# Patient Record
Sex: Female | Born: 1975 | Race: White | Hispanic: No | Marital: Single | Smoking: Current every day smoker
Health system: Southern US, Community
[De-identification: ages and names within clinical notes are randomized; demographics above are authoritative.]

## PROBLEM LIST (undated history)

## (undated) DIAGNOSIS — E119 Type 2 diabetes mellitus without complications: Secondary | ICD-10-CM

## (undated) DIAGNOSIS — I10 Essential (primary) hypertension: Secondary | ICD-10-CM

## (undated) DIAGNOSIS — E7800 Pure hypercholesterolemia, unspecified: Secondary | ICD-10-CM

## (undated) HISTORY — PX: REFRACTIVE SURGERY: SHX103

## (undated) HISTORY — DX: Type 2 diabetes mellitus without complications: E11.9

---

## 1999-06-09 ENCOUNTER — Emergency Department (HOSPITAL_COMMUNITY): Admission: EM | Admit: 1999-06-09 | Discharge: 1999-06-09 | Payer: Self-pay | Admitting: Emergency Medicine

## 1999-06-09 ENCOUNTER — Encounter: Payer: Self-pay | Admitting: Emergency Medicine

## 2004-12-12 ENCOUNTER — Ambulatory Visit: Payer: Self-pay | Admitting: Family Medicine

## 2004-12-17 ENCOUNTER — Inpatient Hospital Stay (HOSPITAL_COMMUNITY): Admission: EM | Admit: 2004-12-17 | Discharge: 2004-12-21 | Payer: Self-pay | Admitting: Emergency Medicine

## 2004-12-17 ENCOUNTER — Ambulatory Visit: Payer: Self-pay | Admitting: Internal Medicine

## 2005-01-19 ENCOUNTER — Ambulatory Visit: Payer: Self-pay | Admitting: Internal Medicine

## 2005-01-25 ENCOUNTER — Ambulatory Visit: Payer: Self-pay | Admitting: Family Medicine

## 2005-02-05 ENCOUNTER — Encounter (HOSPITAL_BASED_OUTPATIENT_CLINIC_OR_DEPARTMENT_OTHER): Admission: RE | Admit: 2005-02-05 | Discharge: 2005-05-06 | Payer: Self-pay | Admitting: Surgery

## 2005-02-06 ENCOUNTER — Ambulatory Visit: Payer: Self-pay | Admitting: Family Medicine

## 2005-02-21 ENCOUNTER — Ambulatory Visit: Payer: Self-pay | Admitting: *Deleted

## 2005-05-14 ENCOUNTER — Ambulatory Visit: Payer: Self-pay | Admitting: Family Medicine

## 2005-08-25 ENCOUNTER — Emergency Department: Payer: Self-pay | Admitting: Internal Medicine

## 2005-12-07 ENCOUNTER — Emergency Department: Payer: Self-pay | Admitting: Emergency Medicine

## 2005-12-08 ENCOUNTER — Other Ambulatory Visit: Payer: Self-pay

## 2005-12-08 ENCOUNTER — Emergency Department: Payer: Self-pay | Admitting: Emergency Medicine

## 2005-12-20 ENCOUNTER — Emergency Department: Payer: Self-pay | Admitting: Emergency Medicine

## 2006-04-29 ENCOUNTER — Emergency Department (HOSPITAL_COMMUNITY): Admission: EM | Admit: 2006-04-29 | Discharge: 2006-04-29 | Payer: Self-pay | Admitting: Emergency Medicine

## 2006-05-18 ENCOUNTER — Emergency Department (HOSPITAL_COMMUNITY): Admission: AD | Admit: 2006-05-18 | Discharge: 2006-05-18 | Payer: Self-pay | Admitting: Emergency Medicine

## 2006-06-17 ENCOUNTER — Ambulatory Visit: Payer: Self-pay | Admitting: Internal Medicine

## 2006-08-31 ENCOUNTER — Emergency Department: Payer: Self-pay | Admitting: Emergency Medicine

## 2007-03-26 ENCOUNTER — Emergency Department: Payer: Self-pay | Admitting: Emergency Medicine

## 2007-05-10 ENCOUNTER — Emergency Department: Payer: Self-pay | Admitting: Internal Medicine

## 2007-07-10 ENCOUNTER — Emergency Department: Payer: Self-pay | Admitting: Emergency Medicine

## 2007-07-13 ENCOUNTER — Emergency Department (HOSPITAL_COMMUNITY): Admission: EM | Admit: 2007-07-13 | Discharge: 2007-07-13 | Payer: Self-pay | Admitting: Emergency Medicine

## 2008-02-17 ENCOUNTER — Emergency Department: Payer: Self-pay | Admitting: Emergency Medicine

## 2008-06-06 ENCOUNTER — Emergency Department: Payer: Self-pay | Admitting: Emergency Medicine

## 2008-08-12 ENCOUNTER — Emergency Department: Payer: Self-pay | Admitting: Emergency Medicine

## 2008-08-13 ENCOUNTER — Ambulatory Visit: Payer: Self-pay | Admitting: Vascular Surgery

## 2008-08-13 ENCOUNTER — Encounter (INDEPENDENT_AMBULATORY_CARE_PROVIDER_SITE_OTHER): Payer: Self-pay | Admitting: Emergency Medicine

## 2008-08-13 ENCOUNTER — Emergency Department (HOSPITAL_COMMUNITY): Admission: EM | Admit: 2008-08-13 | Discharge: 2008-08-13 | Payer: Self-pay | Admitting: Emergency Medicine

## 2008-11-08 ENCOUNTER — Emergency Department (HOSPITAL_COMMUNITY): Admission: EM | Admit: 2008-11-08 | Discharge: 2008-11-08 | Payer: Self-pay | Admitting: Emergency Medicine

## 2009-01-07 ENCOUNTER — Emergency Department: Payer: Self-pay | Admitting: Emergency Medicine

## 2010-05-27 ENCOUNTER — Emergency Department: Payer: Self-pay | Admitting: Emergency Medicine

## 2010-05-30 ENCOUNTER — Inpatient Hospital Stay: Payer: Self-pay | Admitting: Internal Medicine

## 2011-01-16 LAB — GLUCOSE, CAPILLARY: Glucose-Capillary: 268 mg/dL — ABNORMAL HIGH (ref 70–99)

## 2011-02-16 NOTE — H&P (Signed)
NAMEMADYSEN, Bianca Hill                 ACCOUNT NO.:  [id number]   MEDICAL RECORD NO.:  [id number]          PATIENT TYPE:  EMS   LOCATION:  MAJO                         FACILITY:  [facility]   PHYSICIAN:  Sean A. Everardo All, M.D. [facility] OF BIRTH:  [birth year]   DATE OF ADMISSION:  12/17/2004  DATE OF DISCHARGE:                                HISTORY & PHYSICAL   REASON FOR ADMISSION:  Foot ulcer.   HISTORY OF PRESENT ILLNESS:  The patient is a 34 year old woman with one  week of severe pain at the right heel with no associated fever.  She was  treated with Augmentin last week but has not Improved.   PAST MEDICAL HISTORY:  1.  Cigarette smoker.  2.  Seven years of diabetes for which she was prescribed medication years      ago but did not take it consistently and has not done so recently.   MEDICATIONS:  Augmentin and Advil.   SOCIAL HISTORY:  She is single.  She works at [employer].  She is here  with her sister.   FAMILY HISTORY:  Positive for diabetes in both parents.   REVIEW OF SYSTEMS:  She lost about 150 pounds in the past two years.  She  denies the following, nausea, vomiting, shortness of breath, chest pain,  dysuria, rectal bleeding, hematuria, loss of consciousness, sore throat, and  ear pain.   PHYSICAL EXAMINATION:  VITAL SIGNS:  Blood pressure 181/101, heart rate 118,  respiratory rate 20.  The patient is afebrile.  GENERAL:  No distress.  SKIN:  Not diaphoretic.  HEENT:  Head is atraumatic.  Sclerae not icteric.  Pharynx:  No erythema.  NECK:  Supple.  CHEST: Clear to auscultation.  CARDIOVASCULAR:  No JVD, no edema.  Tachycardic, regular rhythm, no murmur.  Pedal pulses are intact.  ABDOMEN:  Soft, obese, nontender.  No hepatosplenomegaly, no mass.  BREASTS/GYNECOLOGIC/RECTAL:  Examinations not done at this time due to  patient's condition.  EXTREMITIES:  The right heel has a bandage that was just placed by Dr.  Read Drivers after his recent debridement.  It is not  removed.  NEUROLOGIC:  Alert, well oriented.  Does not appear anxious nor depressed.  Cranial nerves appear to be intact,, and sensation is intact to touch on the  feet.   LABORATORY DATA AND OTHER STUDIES:  WBC 15,500, hemoglobin 15.8.  Remainder  of laboratory studies pending at this time.   IMPRESSION:  1.  Diabetes with an uncertain metabolic state.  2.  Weight loss probably due to the diabetes.  3.  Foot ulcer contributed to by the diabetes.  4.  Cigarette smoker.  5.  Hypertension which may be situational.  6.  Tachycardia which could be due to her illness itself or thyroid disease.   PLAN:  1.  Blood cultures.  2.  Antibiotics.  3.  Check x-ray of the foot.  4.  Wound care consult.  5.  Will follow hypertension for now.  6.  Will check CBGs and give p.r.n. insulin.  7.  Check TSH.  SAE/MEDQ  D:  12/17/2004  T:  12/17/2004  Job:  [id number]

## 2011-02-16 NOTE — Consult Note (Signed)
NAMEJILLENE, Bianca Hill                 ACCOUNT NO.:  [id number]   MEDICAL RECORD NO.:  [id number]          PATIENT TYPE:  INP   LOCATION:  5741                         FACILITY:  [facility]   PHYSICIAN:  Jake Shark A. Tanda Rockers, M.D.DATE OF BIRTH:  [birth year]   DATE OF CONSULTATION:  02/05/2005  DATE OF DISCHARGE:  12/21/2004                                   CONSULTATION   REASON FOR CONSULTATION:  The patient is a 35 year old insulin-dependent  diabetic who was referred by Dr. Beverley Fiedler for evaluation of an  ulceration on her right heel.   IMPRESSION:  Diabetic foot ulcer.   RECOMMENDATIONS:  The wound was debrided in the wound clinic to healthy  granulating tissue.  We have recommended that the patient bathe twice a day  using antiseptic soap, and we will continue the home health nurse visitation  with the wet-to-dry dressing daily.  We [facility] on a weekly basis.   SUBJECTIVE:  Bianca Hill is a 35 year old female who has had diabetes for  several years.  She has been currently receiving her health care through the  [facility] at [facility].  She developed an ulceration six weeks ago  which is not associated with any specific traumatic incident.  She has been  seen by the home health nurse, with application of topical Accuzyme.  The  wound continues to appear to be becoming larger according to the patient.   PAST MEDICAL HISTORY:  1.  Diabetes.  Her most recent capillary blood glucose was 150.  She is      somewhat elusive in specifics of her management.  2.  Exogenous obesity.   CURRENT MEDICATIONS:  She denies current medications.   PAST SURGICAL HISTORY:  She denies previous surgery.   ALLERGIES:  SHE DENIES ALLERGIES.   FAMILY HISTORY:  Specifically negative for diabetes.  She has two siblings,  neither of which have diabetes and neither of her parents.   SOCIAL HISTORY:  She is single.  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   REVIEW OF SYSTEMS:  Specifically negative for chest pain, shortness of  breath.  Her weight has been stable over the past months.   PHYSICAL EXAMINATION:  GENERAL:  She is alert and oriented and in no acute  distress.  HEENT:  Exam is clear.  NECK:  Supple.  Trachea is midline.  Thyroid nonpalpable.  LUNGS:  Clear.  BREASTS:  Exam is deferred.  ABDOMEN:  Soft.  She has a prominent panniculus.  EXTREMITIES:  Exam is abnormal, with 1+ bilateral edema.  On the right lower  extremity, there is an ulcerated wound on the lateral aspect of the Achilles  area.  The measurements are 4.5 cm in length with a 2.3-cm width and a 3-mm  depth.  There is some granulation tissue after the necrotic tissue has been  debrided.  The dorsalis pedis pulse is readily palpable.  Capillary refill  is normal.   DISCUSSION:  In the wound clinic,  this area was debrided full-thickness,  with a moderate amount of necrotic tissue removed.  A wet-to-dry dressing  was applied.  Cultures, aerobic and anaerobic, were obtained during this  visit, and the patient was instructed in wound care and management.  We have  not placed her on antibiotics.  We have explained the treatment protocol to  the patient in terms that she understands.  She has agreed to be followed up  in this clinic in one week, with interim visits by the home health nurse for  wet-to-dry dressings.      HAN/MEDQ  D:  02/05/2005  T:  02/05/2005  Job:  [id number]   cc:   Fanny Dance. Rankins, M.D.  [address]  [place]  [place] [address]  Fax: [phone]

## 2011-02-16 NOTE — Discharge Summary (Signed)
NAMEHARSHITHA, Bianca Hill                 ACCOUNT NO.:  [id number]   MEDICAL RECORD NO.:  [id number]          PATIENT TYPE:  INP   LOCATION:  5741                         FACILITY:  [facility]   PHYSICIAN:  Rene Paci, M.D. LHCDATE OF BIRTH:  January 07, 1976   DATE OF ADMISSION:  12/17/2004  DATE OF DISCHARGE:  12/21/2004                                 DISCHARGE SUMMARY   DISCHARGE DIAGNOSES:  1. Insulin-dependent diabetes, poorly controlled.  2. Right lower extremity diabetic ulcer with positive methicillin      resistant Staphylococcus aureus.  3. Hypertension.     PAST MEDICAL HISTORY:  1. Tobacco abuse.  2. Diabetes.     HISTORY OF THE PRESENT ILLNESS:  The patient is a 35 year old female with a  one-week history of severe pain in the right heel with no associated fever.  The patient was treated prior to admission with Augmentin, but showed no  improvement; and as a result she presents for admission.  The patient is  admitted for antibiotics and improved diabetic control.   COURSE OF HOSPITALIZATION:  The patient was admitted.  Blood cultures were  performed.  The patient was placed on IV antibiotics.  An x-ray was  performed of the right wound.  Plane films did not show any evidence of  osteomyelitis.  The patient was placed on vancomycin and Avelox, which later  she was change to Bactrim times two weeks.  The patient's blood sugar was  carefully monitored and as a result insulin 70/30 was increased to 26 units  twice daily and subsequently to 24 units twice daily.  Atenolol was also  increased to 50 mg for improved blood pressure control.   DISCHARGE MEDICATIONS:  Medications at discharged included:  1. Insulin 70/30 40 units subcu q.a.m. and q.p.m.  2. Septra DS 1 tab p.o. q.a.m. and 1 tab p.o. q.p.m. times three weeks.  3. Atenolol 50 mg p.o. daily.     FOLLOW UP:  The patient was instructed to follow up with [facility].      MSO/MEDQ  D:  02/27/2005  T:  02/28/2005   Job:  [id number]   cc:   Gregary Signs A. Everardo All, M.D. [facility]

## 2011-07-03 LAB — URINE MICROSCOPIC-ADD ON

## 2011-07-03 LAB — URINALYSIS, ROUTINE W REFLEX MICROSCOPIC
Bilirubin Urine: NEGATIVE
Glucose, UA: >1000 — AB
Ketones, ur: NEGATIVE
Leukocytes, UA: NEGATIVE
Nitrite: NEGATIVE
Protein, ur: 100 — AB
Specific Gravity, Urine: 1.041 — ABNORMAL HIGH
Urobilinogen, UA: 0.2
pH: 5.5

## 2011-07-12 LAB — CBC
HCT: 45.2
MCHC: 34.5
MCV: 89.4
Platelets: 199
RDW: 12.6
WBC: 10.5

## 2011-07-12 LAB — POCT I-STAT CREATININE: Creatinine, Ser: 0.5

## 2011-07-12 LAB — I-STAT 8, (EC8 V) (CONVERTED LAB)
BUN: 9
Bicarbonate: 22.1
HCT: 49 — ABNORMAL HIGH
Operator id: 265201
pCO2, Ven: 40.6 — ABNORMAL LOW

## 2011-07-12 LAB — CULTURE, ROUTINE-ABSCESS

## 2011-07-12 LAB — DIFFERENTIAL
Basophils Relative: 0
Eosinophils Absolute: 0.2
Eosinophils Relative: 2
Lymphs Abs: 2.8

## 2012-01-28 ENCOUNTER — Ambulatory Visit: Payer: Self-pay | Admitting: Internal Medicine

## 2012-01-30 ENCOUNTER — Ambulatory Visit: Payer: Self-pay | Admitting: Internal Medicine

## 2012-12-11 ENCOUNTER — Ambulatory Visit: Payer: Self-pay | Admitting: Family Medicine

## 2013-02-09 ENCOUNTER — Ambulatory Visit: Payer: Self-pay | Admitting: Family Medicine

## 2013-02-09 ENCOUNTER — Encounter: Payer: Self-pay | Admitting: Family Medicine

## 2013-02-09 DIAGNOSIS — Z0289 Encounter for other administrative examinations: Secondary | ICD-10-CM

## 2014-02-09 ENCOUNTER — Emergency Department: Payer: Self-pay | Admitting: Emergency Medicine

## 2014-02-09 LAB — URINALYSIS, COMPLETE
Bilirubin,UR: NEGATIVE
Glucose,UR: 50 mg/dL (ref 0–75)
KETONE: NEGATIVE
NITRITE: NEGATIVE
Ph: 5 (ref 4.5–8.0)
Protein: >=500
SPECIFIC GRAVITY: 1.02 (ref 1.003–1.030)
Squamous Epithelial: 10

## 2014-02-09 LAB — BASIC METABOLIC PANEL
Anion Gap: 9 (ref 7–16)
BUN: 26 mg/dL — ABNORMAL HIGH (ref 7–18)
CHLORIDE: 101 mmol/L (ref 98–107)
CO2: 25 mmol/L (ref 21–32)
CREATININE: 0.94 mg/dL (ref 0.60–1.30)
Calcium, Total: 9.2 mg/dL (ref 8.5–10.1)
EGFR (African American): >60
Glucose: 184 mg/dL — ABNORMAL HIGH (ref 65–99)
OSMOLALITY: 280 (ref 275–301)
POTASSIUM: 3.8 mmol/L (ref 3.5–5.1)
SODIUM: 135 mmol/L — AB (ref 136–145)

## 2014-02-09 LAB — CBC WITH DIFFERENTIAL/PLATELET
BASOS ABS: 0.1 10*3/uL (ref 0.0–0.1)
BASOS PCT: 0.6 %
EOS PCT: 2.1 %
Eosinophil #: 0.3 10*3/uL (ref 0.0–0.7)
HCT: 47.4 % — ABNORMAL HIGH (ref 35.0–47.0)
HGB: 16.2 g/dL — AB (ref 12.0–16.0)
Lymphocyte #: 4.5 10*3/uL — ABNORMAL HIGH (ref 1.0–3.6)
Lymphocyte %: 32.9 %
MCH: 31.1 pg (ref 26.0–34.0)
MCHC: 34.2 g/dL (ref 32.0–36.0)
MCV: 91 fL (ref 80–100)
MONOS PCT: 6.4 %
Monocyte #: 0.9 x10 3/mm (ref 0.2–0.9)
Neutrophil #: 8 10*3/uL — ABNORMAL HIGH (ref 1.4–6.5)
Neutrophil %: 58 %
Platelet: 236 10*3/uL (ref 150–440)
RBC: 5.21 10*6/uL — ABNORMAL HIGH (ref 3.80–5.20)
RDW: 12.9 % (ref 11.5–14.5)
WBC: 13.8 10*3/uL — ABNORMAL HIGH (ref 3.6–11.0)

## 2014-07-28 ENCOUNTER — Inpatient Hospital Stay: Payer: Self-pay | Admitting: Internal Medicine

## 2014-07-28 LAB — CBC WITH DIFFERENTIAL/PLATELET
BASOS ABS: 0.1 10*3/uL (ref 0.0–0.1)
BASOS PCT: 0.5 %
Eosinophil #: 0.6 10*3/uL (ref 0.0–0.7)
Eosinophil %: 4.1 %
HCT: 47.7 % — ABNORMAL HIGH (ref 35.0–47.0)
HGB: 15.4 g/dL (ref 12.0–16.0)
LYMPHS PCT: 18.8 %
Lymphocyte #: 2.8 10*3/uL (ref 1.0–3.6)
MCH: 30.1 pg (ref 26.0–34.0)
MCHC: 32.3 g/dL (ref 32.0–36.0)
MCV: 93 fL (ref 80–100)
MONO ABS: 1.2 x10 3/mm — AB (ref 0.2–0.9)
MONOS PCT: 8 %
Neutrophil #: 10.4 10*3/uL — ABNORMAL HIGH (ref 1.4–6.5)
Neutrophil %: 68.6 %
PLATELETS: 201 10*3/uL (ref 150–440)
RBC: 5.11 10*6/uL (ref 3.80–5.20)
RDW: 13.2 % (ref 11.5–14.5)
WBC: 15.2 10*3/uL — ABNORMAL HIGH (ref 3.6–11.0)

## 2014-07-28 LAB — BASIC METABOLIC PANEL
Anion Gap: 6 — ABNORMAL LOW (ref 7–16)
BUN: 24 mg/dL — ABNORMAL HIGH (ref 7–18)
CHLORIDE: 101 mmol/L (ref 98–107)
CO2: 28 mmol/L (ref 21–32)
Calcium, Total: 8.9 mg/dL (ref 8.5–10.1)
Creatinine: 1.71 mg/dL — ABNORMAL HIGH (ref 0.60–1.30)
EGFR (African American): 44 — ABNORMAL LOW
GFR CALC NON AF AMER: 36 — AB
Glucose: 328 mg/dL — ABNORMAL HIGH (ref 65–99)
POTASSIUM: 4 mmol/L (ref 3.5–5.1)
SODIUM: 135 mmol/L — AB (ref 136–145)

## 2014-07-29 LAB — BASIC METABOLIC PANEL
Anion Gap: 9 (ref 7–16)
BUN: 25 mg/dL — ABNORMAL HIGH (ref 7–18)
CALCIUM: 8.2 mg/dL — AB (ref 8.5–10.1)
CREATININE: 1.47 mg/dL — AB (ref 0.60–1.30)
Chloride: 102 mmol/L (ref 98–107)
Co2: 27 mmol/L (ref 21–32)
EGFR (African American): 52 — ABNORMAL LOW
EGFR (Non-African Amer.): 43 — ABNORMAL LOW
Glucose: 345 mg/dL — ABNORMAL HIGH (ref 65–99)
OSMOLALITY: 294 (ref 275–301)
Potassium: 4.2 mmol/L (ref 3.5–5.1)
Sodium: 138 mmol/L (ref 136–145)

## 2014-07-29 LAB — CBC WITH DIFFERENTIAL/PLATELET
BASOS ABS: 0 10*3/uL (ref 0.0–0.1)
BASOS PCT: 0.3 %
EOS ABS: 0 10*3/uL (ref 0.0–0.7)
Eosinophil %: 0.2 %
HCT: 43.1 % (ref 35.0–47.0)
HGB: 14.4 g/dL (ref 12.0–16.0)
LYMPHS ABS: 1.2 10*3/uL (ref 1.0–3.6)
LYMPHS PCT: 10.6 %
MCH: 31.3 pg (ref 26.0–34.0)
MCHC: 33.4 g/dL (ref 32.0–36.0)
MCV: 94 fL (ref 80–100)
MONO ABS: 0.6 x10 3/mm (ref 0.2–0.9)
MONOS PCT: 5.1 %
Neutrophil #: 9.2 10*3/uL — ABNORMAL HIGH (ref 1.4–6.5)
Neutrophil %: 83.8 %
Platelet: 166 10*3/uL (ref 150–440)
RBC: 4.59 10*6/uL (ref 3.80–5.20)
RDW: 13.2 % (ref 11.5–14.5)
WBC: 10.9 10*3/uL (ref 3.6–11.0)

## 2014-07-30 LAB — BASIC METABOLIC PANEL
ANION GAP: 8 (ref 7–16)
BUN: 29 mg/dL — AB (ref 7–18)
CALCIUM: 7.9 mg/dL — AB (ref 8.5–10.1)
CHLORIDE: 102 mmol/L (ref 98–107)
CREATININE: 1.16 mg/dL (ref 0.60–1.30)
Co2: 24 mmol/L (ref 21–32)
EGFR (African American): >60
EGFR (Non-African Amer.): 56 — ABNORMAL LOW
GLUCOSE: 309 mg/dL — AB (ref 65–99)
Osmolality: 286 (ref 275–301)
POTASSIUM: 3.9 mmol/L (ref 3.5–5.1)
Sodium: 134 mmol/L — ABNORMAL LOW (ref 136–145)

## 2014-08-02 LAB — CULTURE, BLOOD (SINGLE)

## 2014-11-09 ENCOUNTER — Emergency Department: Payer: Self-pay | Admitting: Emergency Medicine

## 2014-12-06 ENCOUNTER — Emergency Department: Payer: Self-pay | Admitting: Emergency Medicine

## 2014-12-10 ENCOUNTER — Other Ambulatory Visit: Payer: Self-pay | Admitting: Nurse Practitioner

## 2015-01-22 NOTE — Discharge Summary (Signed)
PATIENT NAME:  Bianca Hill, Bianca Hill MR#:  [id number] DATE OF BIRTH:  January 12, 1976  DATE OF ADMISSION:  07/28/2014 DATE OF DISCHARGE:  07/30/2014  PRIMARY CARE PHYSICIAN: Corky DownsJaved Masoud, MD  DISCHARGE DIAGNOSES: 1.  Acute respiratory failure with hypoxia.  2.  Severe sepsis with pneumonia.  3.  Acute renal failure.  4.  Accelerated hypertension.  5.  Diabetes.  6.  Obesity.   CONDITION: Stable.   CODE STATUS: FULL.  HOME MEDICATIONS: Please refer to the medication reconciliation list.   DISCHARGE DIET: Low-sodium, low-fat, low-cholesterol, ADA diet.   DISCHARGE ACTIVITY: As tolerated.   FOLLOWUP CARE: Follow up with PCP and Dr. Tedd SiasSolum within 1 to 2 weeks.   REASON FOR ADMISSION: Feeling horrible.   HOSPITAL COURSE: The patient is a 39 year old Caucasian female with a history of hypertension, diabetes, hyperlipidemia and obesity who presented to the ED with congestion, cough, sneezing and shortness of breath. The patient's O2 sat decreased to 84 in room air. Chest x-ray showed pneumonia. The patient had an elevated white count and tachycardia. For detailed history and physical examination, please refer to the admission note dictated by Dr. Renae GlossWieting. Laboratory data showed WBC 15.2, hemoglobin 15.4. Electrolytes were normal. BUN 24, creatinine 0.71.  1.  For acute respiratory failure with hypoxia, after admission the patient has been treated with oxygen and nebulizer treatment. Symptoms have much improved.  2.  Severe sepsis with pneumonia, leukocytosis, tachycardia, and renal failure. After admission the patient has been treated with Rocephin and Zithromax. In addition, the patient was treated with IV fluid support. Blood culture is negative. The patient's symptoms have much improved. White count decreased to normal, 10.9.  3.  Accelerated hypertension. The patient has been treated with Norvasc. Lisinopril/HCTZ was discontinued due to acute renal failure. Blood pressure is better controlled. We  resumed HCTZ/lisinopril after discharge.  4.  Acute renal failure. As mentioned above, lisinopril/HCTZ was discontinued. The patient was treated with IV fluid support. Creatinine decreased from 1.71 to 1.16.  5.  Diabetes. The patient has been treated with sliding scale with NovoLog 70/30 90 units b.i.d.; however, the patient's blood sugar is not very well controlled. His blood sugar is about 300. The patient needs follow-up with Dr. Tedd SiasSolum as outpatient.   The patient's symptoms have much improved. She has no complaints. Off oxygen. Vital signs are stable. She is clinically stable and will be discharged to home today. The patient will be treated with Levaquin for additional 7 days. I discussed the patient's discharge plan with the patient, nurse and case manager.   TIME SPENT: About 39 minutes.  ____________________________ Shaune PollackQing Verdene Creson, MD qc:sb D: 07/30/2014 13:45:49 ET T: 07/30/2014 14:48:36 ET JOB#: [id number]  cc: Shaune PollackQing Treyten Monestime, MD, <Dictator> Shaune PollackQING Ahliyah Nienow MD ELECTRONICALLY SIGNED 07/30/2014 17:35

## 2015-01-22 NOTE — H&P (Signed)
PATIENT NAME:  Bianca Hill, Bianca Hill#:  [id number] DATE OF BIRTH:  September 15, 1976  DATE OF ADMISSION:  07/28/2014  PRIMARY CARE PHYSICIAN: Corky DownsJaved Masoud, MD  CHIEF COMPLAINT: Feeling horrible.   HISTORY OF PRESENT ILLNESS: This is a 39 year old female who developed a cold on Saturday, congestion, cough, sneezing. It got worse and worse over time. Today she had trouble walking, talking, and breathing. Yesterday her blood pressure was up; she went to the urgent care, and her pulse oximetry was 84% and she was sent to the ER for further evaluation. In the ER, she was still hypoxic, pulse oximetry 86% on room air. Found to have pneumonia on chest x-ray, elevated white count, and tachycardic. Also in acute renal failure. Hospitalist services were contacted for further evaluation. Her cough is nonproductive.   PAST MEDICAL HISTORY: Diabetes, hypertension, hyperlipidemia, obesity.   PAST SURGICAL HISTORY: Right in foot infection.   ALLERGIES: No known drug allergies.   MEDICATIONS INCLUDE: Aspirin 325 mg daily, hydrochlorothiazide, lisinopril 25/20 mg 1 tablet daily, Novolin 70/30 with 90 units subcutaneous injection twice a day.   SOCIAL HISTORY: Positive smoker, 2 packs per day for 20 years. No alcohol. No drug use. 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    FAMILY HISTORY: Father had esophageal cancer and hypertension. Mother with stroke.   REVIEW OF SYSTEMS:  CONSTITUTIONAL: No fever, chills, or sweats. No weight loss or weight gain. Positive for fatigue.  EYES: No blurry vision.  EARS, NOSE, MOUTH AND THROAT: Positive for runny nose. Positive for sore throat. No difficulty swallowing.  CARDIOVASCULAR: No chest pain. No palpitations.  RESPIRATORY: Positive for shortness of breath. Positive for cough. No hemoptysis.  GASTROINTESTINAL: No nausea. No vomiting. No abdominal pain. No diarrhea. No constipation. No bright red blood per rectum. No melena.  GENITOURINARY: No burning on urination or hematuria.   MUSCULOSKELETAL: No joint pain or muscle pain.  INTEGUMENT: No rashes or eruptions.  NEUROLOGIC: No fainting or blackouts.  PSYCHIATRIC: No anxiety or depression.  ENDOCRINE: No thyroid problems.  HEMATOLOGIC AND LYMPHATIC: No anemia. No easy bruising or bleeding.   PHYSICAL EXAMINATION:  VITAL SIGNS: On presentation to the ER included temperature 98.4, pulse 100, respirations 22, blood pressure 208/95, pulse oximetry 86% on room air.  GENERAL: No respiratory distress.  EYES: Conjunctivae and lids normal. Pupils equal, round, and reactive to light. Extraocular muscles intact. No nystagmus.  EARS, NOSE, MOUTH AND THROAT: Tympanic membranes with no erythema. Nasal mucosa with no erythema. Throat with no erythema, no exudate seen. Lips and gums with no lesions.  NECK: No JVD. No bruits. No lymphadenopathy. No thyromegaly. No thyroid nodules palpated.  RESPIRATORY: Decreased breath sounds bilaterally. Positive expiratory wheeze. No use of accessory muscles to breathe.  CARDIOVASCULAR SYSTEM: S1 and S2, tachycardic. No gallops, rubs, or murmurs heard. Carotid upstroke 2+ bilaterally. No bruits.  EXTREMITIES: Dorsalis pedis pulses 2+ bilaterally. Trace edema of the lower extremities.  ABDOMEN: Soft, nontender. No organomegaly or splenomegaly. Normoactive bowel sounds. No masses felt.  LYMPHATIC: No lymph nodes in the neck.  MUSCULOSKELETAL: Trace edema. No clubbing. No cyanosis. On oxygen.  NEUROLOGIC: Cranial nerves II through XII grossly intact. Deep tendon reflexes 1+ bilateral lower extremities.  SKIN: No ulcers or lesions seen.  PSYCHIATRIC: The patient is oriented to person, place, and time.   LABORATORY AND RADIOLOGICAL DATA: Chest x-ray showed interstitial opacities, favors bronchitis, atypical pneumonia. White blood cell count 15.2, H and H 15.4 and 47.7, platelet count of 201,000. Glucose 328, BUN 24,  creatinine 0.71, sodium 135, potassium 4.0, chloride 101, CO2 of 28, calcium 8.9.    ASSESSMENT AND PLAN:  1.  Acute respiratory failure with pulse oximetry 86% on room air. We will give oxygen supplementation for hypoxia.  2.  Severe sepsis with pneumonia, leukocytosis, tachycardia, and acute renal failure. We will give IV fluid hydration, Rocephin, and Zithromax. We will get sputum culture if possible. Antibiotics were hung; and no blood cultures were ordered. I will order the blood culture.  3.  Acute renal failure. We will give IV fluid hydration. Hold the lisinopril/hydrochlorothiazide for now.  4.  Accelerated hypertension. We will give a STAT dose of Norvasc.  5.  Diabetes. We will give a STAT dose of her 70/30 insulin, and we will continue that twice a day. Sugars will be high on while the patient is on steroids.  6.  Tobacco abuse. Smoking cessation counseling done for 3 minutes by me. Nicotine patch ordered.   TIME SPENT ON ADMISSION: 50 minutes    ____________________________ Herschell Dimes. Renae Gloss, MD rjw:MT D: 07/28/2014 19:00:33 ET T: 07/28/2014 19:30:14 ET JOB#: [id number]  cc: Herschell Dimes. Renae Gloss, MD, <Dictator> Corky Downs, MD Salley Scarlet MD ELECTRONICALLY SIGNED 08/02/2014 13:28

## 2015-06-26 IMAGING — CR DG CHEST 1V PORT
1 series · 1 of 1 positions shown · non-contrast
Comparison: 07/28/2014

CLINICAL DATA: Chest pain, smoker

EXAM:
PORTABLE CHEST - 1 VIEW

[ap]
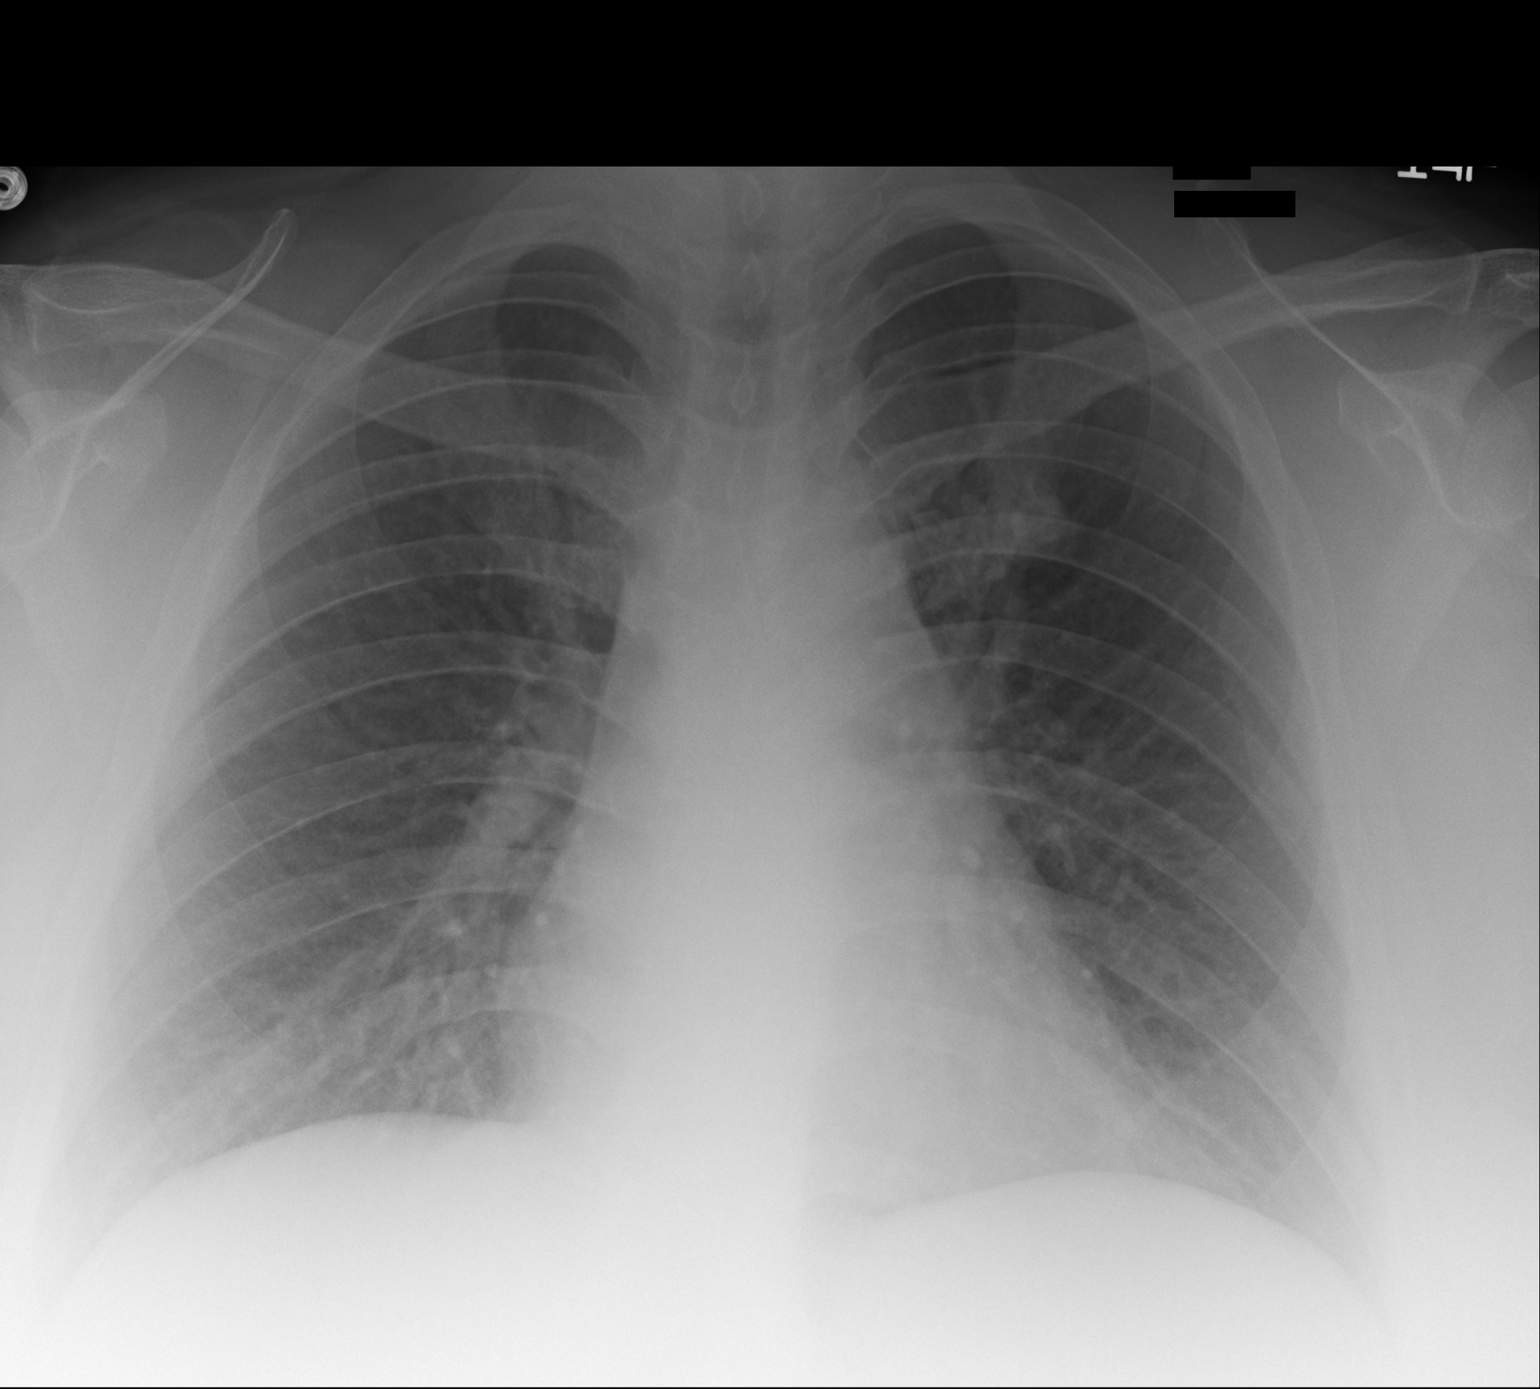

[1 of 1 positions shown; findings below may reference images not displayed]

FINDINGS: Cardiomediastinal silhouette is stable. No acute infiltrate or
pleural effusion. No pulmonary edema. Bony thorax is unremarkable.
IMPRESSION: No active disease.

## 2015-07-18 ENCOUNTER — Encounter: Payer: Self-pay | Admitting: Emergency Medicine

## 2015-07-18 ENCOUNTER — Emergency Department
Admission: EM | Admit: 2015-07-18 | Discharge: 2015-07-18 | Disposition: A | Discharge status: Home / Self Care | Payer: Self-pay | Attending: Emergency Medicine | Admitting: Emergency Medicine

## 2015-07-18 DIAGNOSIS — E119 Type 2 diabetes mellitus without complications: Secondary | ICD-10-CM | POA: Insufficient documentation

## 2015-07-18 DIAGNOSIS — H53131 Sudden visual loss, right eye: Secondary | ICD-10-CM | POA: Insufficient documentation

## 2015-07-18 DIAGNOSIS — I10 Essential (primary) hypertension: Secondary | ICD-10-CM | POA: Insufficient documentation

## 2015-07-18 DIAGNOSIS — Z79899 Other long term (current) drug therapy: Secondary | ICD-10-CM | POA: Insufficient documentation

## 2015-07-18 DIAGNOSIS — Z720 Tobacco use: Secondary | ICD-10-CM | POA: Insufficient documentation

## 2015-07-18 NOTE — ED Notes (Addendum)
Pt c/o right eye center field vision change , states everything in the center is red in color and blurry, everything on the outside field is normal..  Denies pain or injury.

## 2015-07-18 NOTE — Discharge Instructions (Signed)
As we discussed, it is extremely important that you go to see the ophthalmologist at 2:00 today as scheduled.  There are number of possible causes for your sudden vision loss (vitreous hemorrhage, central retinal vein occlusion, etc.), and the eye doctors have the tools and skills necessary to further diagnose the cause of the problem and help you with treatment.  Please arrive to the clinic at about 1:45 PM.     Visual Disturbances You have had a disturbance in your vision. This may be caused by various conditions, such as:  Migraines. Migraine headaches are often preceded by a disturbance in vision. Blind spots or light flashes are followed by a headache. This type of visual disturbance is temporary. It does not damage the eye.  Glaucoma. This is caused by increased pressure in the eye. Symptoms include haziness, blurred vision, or seeing rainbow colored circles when looking at bright lights. Partial or complete visual loss can occur. You may or may not experience eye pain. Visual loss may be gradual or sudden and is irreversible. Glaucoma is the leading cause of blindness.  Retina problems. Vision will be reduced if the retina becomes detached or if there is a circulation problem as with diabetes, high blood pressure, or a mini-stroke. Symptoms include seeing "floaters," flashes of light, or shadows, as if a curtain has fallen over your eye.  Optic nerve problems. The main nerve in your eye can be damaged by redness, soreness, and swelling (inflammation), poor circulation, drugs, and toxins. It is very important to have a complete exam done by a specialist to determine the exact cause of your eye problem. The specialist may recommend medicines or surgery, depending on the cause of the problem. This can help prevent further loss of vision or reduce the risk of having a stroke. Contact the caregiver to whom you have been referred and arrange for follow-up care right away. SEEK IMMEDIATE MEDICAL CARE  IF:   Your vision gets worse.  You develop severe headaches.  You have any weakness or numbness in the face, arms, or legs.  You have any trouble speaking or walking.   This information is not intended to replace advice given to you by your health care provider. Make sure you discuss any questions you have with your health care provider.   Document Released: 10/25/2004 Document Revised: 12/10/2011 Document Reviewed: 02/24/2014 Elsevier Interactive Patient Education [employer]2016 Elsevier Inc.

## 2015-07-18 NOTE — ED Notes (Signed)
Pt did state that she took her bp meds about ago.

## 2015-07-18 NOTE — ED Notes (Signed)
Explained the wait to pt  the patient is agreeable

## 2015-07-18 NOTE — ED Notes (Signed)
Pt presents with right side vision change, pt reports central blurry and tinged red when she looks with her right eye. Pt with no peripheral vision changes. Pt denies any pain or headache.

## 2015-07-18 NOTE — ED Provider Notes (Signed)
[facility] Emergency Department Provider Note  ____________________________________________  Time seen: Approximately 10:49 AM  I have reviewed the triage vital signs and the nursing notes.   HISTORY  Chief Complaint Eye Problem    HPI Bianca Hill is a 39 y.o. female with a history of hypertension, diabetes, morbid obesity, and tobacco use who presents with approximately one week of decreased vision in her right eye.  She reports that she was watching TV and sneezed, and when that happened she immediately had painless loss of central vision in her right eye.  She reports that it looks as if she is looking through a red or tea-colored lens with significantly decreased vision in the center.She can see in her peripheral vision without any difficulties.  She reports that though the onset was acute, it is not gotten better or worse in the week since it happened.  She had no point has had headache, pain in her eye, facial pain, numbness or weakness in any of her extremities, chest pain, shortness of breath, leg swelling or pain, abdominal pain, nausea, or vomiting.  She describes the visual symptoms as severe but otherwise the patient has no complaints at this time.  She has not gone to an eye doctor because she has no insurance.   Past Medical History  Diagnosis Date  . Diabetes mellitus without complication [facility](HCC)     Patient Active Problem List   Diagnosis Date Noted  . IDDM (insulin dependent diabetes mellitus) (HCC) 02/09/2013    History reviewed. No pertinent past surgical history.  Current Outpatient Rx  Name  Route  Sig  Dispense  Refill  . lisinopril-hydrochlorothiazide (PRINZIDE,ZESTORETIC) 20-25 MG tablet   Oral   Take 1 tablet by mouth daily.           Allergies Review of patient's allergies indicates no known allergies.  No family history on file.  Social History Social History  Substance Use Topics  . Smoking status: Current Some Day  Smoker  . Smokeless tobacco: None  . Alcohol Use: No    Review of Systems Constitutional: No fever/chills Eyes: Central vision loss with a red tinge to the vision in her right eye only, acute onset ENT: No sore throat. Cardiovascular: Denies chest pain. Respiratory: Denies shortness of breath. Gastrointestinal: No abdominal pain.  No nausea, no vomiting.  No diarrhea.  No constipation. Genitourinary: Negative for dysuria. Musculoskeletal: Negative for back pain. Skin: Negative for rash. Neurological: Negative for headaches, focal weakness or numbness.  10-point ROS otherwise negative.  ____________________________________________   PHYSICAL EXAM:  VITAL SIGNS: ED Triage Vitals  Enc Vitals Group     BP 07/18/15 0822 211/83 mmHg     Pulse Rate 07/18/15 0822 70     Resp 07/18/15 0822 20     Temp 07/18/15 0822 98.3 F (36.8 C)     Temp Source 07/18/15 0822 Oral     SpO2 07/18/15 0822 96 %     Weight 07/18/15 0822 370 lb (167.831 kg)     Height 07/18/15 0822 6\' 1"  (1.854 m)     Head Cir --      Peak Flow --      Pain Score --      Pain Loc --      Pain Edu? --      Excl. in GC? --     Constitutional: Alert and oriented. Well appearing and in no acute distress. Eyes: Conjunctivae are normal. PERRL. EOMI.  funduscopic exam was  performed with the panoptic scope.  The left funduscopic exam is normal.  On the right it is difficult for me to assess because there seems to be a red blurred or which is obscuring my visualization of the fundus and vessels.  The eyes are soft and nontender to palpation through the eyelid.  Significantly decreased visual acuity as per the nurse; she can barely make out the large E on the eye chart with the right eye.  She has normal lateral and medial visual fields. Head: Atraumatic. Nose: No congestion/rhinnorhea. Neck: No stridor.  No meningimus. Cardiovascular: Normal rate, regular rhythm. Grossly normal heart sounds.  Good peripheral  circulation. Respiratory: Normal respiratory effort.  No retractions. Lungs CTAB. Neurologic:  Normal speech and language. No gross focal neurologic deficits are appreciated.  Psychiatric: Mood and affect are normal. Speech and behavior are normal.  ____________________________________________   LABS (all labs ordered are listed, but only abnormal results are displayed)  Labs Reviewed - No data to display ____________________________________________  EKG  Not indicated ____________________________________________  RADIOLOGY  Not indicated  ____________________________________________   PROCEDURES  Procedure(s) performed: None  Critical Care performed: No ____________________________________________   INITIAL IMPRESSION / ASSESSMENT AND PLAN / ED COURSE  Pertinent labs & imaging results that were available during my care of the patient were reviewed by me and considered in my medical decision making (see chart for details).  The patient has no signs or symptoms of CVA/TIA.  She has no pain in her eye which makes central retinal artery occlusion, acute angle-closure glaucoma, or other emergent ophthalmological condition unlikely.  My differential diagnosis includes vitreous hemorrhage, vitreous detachment, central retinal vein occlusion, etc.  I called and spoke by phone with Dr. Inez Pilgrim, the ophthalmologist on call at this time.  He agreed to see the patient urgently in clinic today and schedule an appointment for her and about 2 hours.  He felt that no additional intervention on my part was required at this time.  I stressed to the patient the importance of follow-up as scheduled.  I gave her my usual and customary return precautions.  ____________________________________________  FINAL CLINICAL IMPRESSION(S) / ED DIAGNOSES  Final diagnoses:  Sudden loss of vision, right      NEW MEDICATIONS STARTED DURING THIS VISIT:  Discharge Medication List as of 07/18/2015  11:15 AM       Loleta Rose, MD 07/18/15 1404

## 2015-09-19 ENCOUNTER — Emergency Department
Admission: EM | Admit: 2015-09-19 | Discharge: 2015-09-20 | Disposition: A | Discharge status: Home / Self Care | Payer: Self-pay | Attending: Emergency Medicine | Admitting: Emergency Medicine

## 2015-09-19 ENCOUNTER — Other Ambulatory Visit: Payer: Self-pay

## 2015-09-19 ENCOUNTER — Emergency Department: Payer: Self-pay

## 2015-09-19 ENCOUNTER — Encounter: Payer: Self-pay | Admitting: Emergency Medicine

## 2015-09-19 DIAGNOSIS — E119 Type 2 diabetes mellitus without complications: Secondary | ICD-10-CM | POA: Insufficient documentation

## 2015-09-19 DIAGNOSIS — I159 Secondary hypertension, unspecified: Secondary | ICD-10-CM | POA: Insufficient documentation

## 2015-09-19 DIAGNOSIS — R600 Localized edema: Secondary | ICD-10-CM | POA: Insufficient documentation

## 2015-09-19 DIAGNOSIS — R609 Edema, unspecified: Secondary | ICD-10-CM

## 2015-09-19 DIAGNOSIS — F17210 Nicotine dependence, cigarettes, uncomplicated: Secondary | ICD-10-CM | POA: Insufficient documentation

## 2015-09-19 DIAGNOSIS — Z79899 Other long term (current) drug therapy: Secondary | ICD-10-CM | POA: Insufficient documentation

## 2015-09-19 DIAGNOSIS — Z7984 Long term (current) use of oral hypoglycemic drugs: Secondary | ICD-10-CM | POA: Insufficient documentation

## 2015-09-19 HISTORY — DX: Pure hypercholesterolemia, unspecified: E78.00

## 2015-09-19 HISTORY — DX: Essential (primary) hypertension: I10

## 2015-09-19 LAB — CBC WITH DIFFERENTIAL/PLATELET
BASOS ABS: 0.1 10*3/uL (ref 0–0.1)
BASOS PCT: 1 %
EOS ABS: 0.3 10*3/uL (ref 0–0.7)
Eosinophils Relative: 3 %
HEMATOCRIT: 43.6 % (ref 35.0–47.0)
HEMOGLOBIN: 14.3 g/dL (ref 12.0–16.0)
Lymphocytes Relative: 29 %
Lymphs Abs: 2.7 10*3/uL (ref 1.0–3.6)
MCH: 30.3 pg (ref 26.0–34.0)
MCHC: 32.8 g/dL (ref 32.0–36.0)
MCV: 92.2 fL (ref 80.0–100.0)
Monocytes Absolute: 0.7 10*3/uL (ref 0.2–0.9)
Monocytes Relative: 7 %
NEUTROS ABS: 5.6 10*3/uL (ref 1.4–6.5)
NEUTROS PCT: 60 %
Platelets: 220 10*3/uL (ref 150–440)
RBC: 4.73 MIL/uL (ref 3.80–5.20)
RDW: 13.3 % (ref 11.5–14.5)
WBC: 9.4 10*3/uL (ref 3.6–11.0)

## 2015-09-19 LAB — COMPREHENSIVE METABOLIC PANEL
ALBUMIN: 1.9 g/dL — AB (ref 3.5–5.0)
ALK PHOS: 72 U/L (ref 38–126)
ALT: 21 U/L (ref 14–54)
ANION GAP: 4 — AB (ref 5–15)
AST: 24 U/L (ref 15–41)
BILIRUBIN TOTAL: 0.3 mg/dL (ref 0.3–1.2)
BUN: 24 mg/dL — AB (ref 6–20)
CALCIUM: 8.1 mg/dL — AB (ref 8.9–10.3)
CO2: 25 mmol/L (ref 22–32)
Chloride: 108 mmol/L (ref 101–111)
Creatinine, Ser: 1.74 mg/dL — ABNORMAL HIGH (ref 0.44–1.00)
GFR calc Af Amer: 42 mL/min — ABNORMAL LOW (ref >60)
GFR calc non Af Amer: 36 mL/min — ABNORMAL LOW (ref >60)
GLUCOSE: 291 mg/dL — AB (ref 65–99)
Potassium: 4.4 mmol/L (ref 3.5–5.1)
SODIUM: 137 mmol/L (ref 135–145)
TOTAL PROTEIN: 4.9 g/dL — AB (ref 6.5–8.1)

## 2015-09-19 LAB — BRAIN NATRIURETIC PEPTIDE: B Natriuretic Peptide: 128 pg/mL — ABNORMAL HIGH (ref 0.0–100.0)

## 2015-09-19 NOTE — ED Notes (Addendum)
Pt presents to ED with swelling to her leg bilaterally and her abd for the past several days. Worsened yesterday and pt states she was unable to bend her legs to get into her vehicle to come to ED. Pt states she has been feeling sob while walking short distances. Hx of similar symptoms but pt reports never as severe. Pt currently in wheelchair with no increased work of breathing noted at this time. Pt reports that due to financial constraints she is unable to afford all of her medications at this time and is only taking her diabetic medications.

## 2015-09-20 LAB — URINALYSIS COMPLETE WITH MICROSCOPIC (ARMC ONLY)
Bilirubin Urine: NEGATIVE
Glucose, UA: >500 mg/dL — AB
Hgb urine dipstick: NEGATIVE
Ketones, ur: NEGATIVE mg/dL
Leukocytes, UA: NEGATIVE
Nitrite: NEGATIVE
PH: 6 (ref 5.0–8.0)
Specific Gravity, Urine: 1.017 (ref 1.005–1.030)

## 2015-09-20 MED ORDER — METOPROLOL TARTRATE 50 MG PO TABS: 50.0000 mg | ORAL_TABLET | Freq: Two times a day (BID) | ORAL | Status: AC

## 2015-09-20 MED ORDER — FUROSEMIDE 40 MG PO TABS
20.0000 mg | ORAL_TABLET | Freq: Once | ORAL | Status: AC
  Administered 2015-09-20: 20 mg via ORAL
  Filled 2015-09-20: qty 1

## 2015-09-20 MED ORDER — METOPROLOL TARTRATE 50 MG PO TABS
50.0000 mg | ORAL_TABLET | Freq: Once | ORAL | Status: AC
  Administered 2015-09-20: 50 mg via ORAL
  Filled 2015-09-20: qty 1

## 2015-09-20 MED ORDER — HYDROCHLOROTHIAZIDE 25 MG PO TABS
25.0000 mg | ORAL_TABLET | Freq: Every day | ORAL | Status: DC
  Administered 2015-09-20: 25 mg via ORAL

## 2015-09-20 MED ORDER — METFORMIN HCL 500 MG PO TABS: 500.0000 mg | ORAL_TABLET | Freq: Two times a day (BID) | ORAL | Status: DC

## 2015-09-20 MED ORDER — HYDROCHLOROTHIAZIDE 25 MG PO TABS
ORAL_TABLET | ORAL | Status: AC
  Administered 2015-09-20: 25 mg via ORAL
  Filled 2015-09-20: qty 1

## 2015-09-20 MED ORDER — SIMVASTATIN 20 MG PO TABS: 20.0000 mg | ORAL_TABLET | Freq: Every day | ORAL | Status: DC

## 2015-09-20 MED ORDER — LISINOPRIL 20 MG PO TABS
20.0000 mg | ORAL_TABLET | Freq: Once | ORAL | Status: AC
  Administered 2015-09-20: 20 mg via ORAL
  Filled 2015-09-20: qty 1

## 2015-09-20 MED ORDER — HYDROCHLOROTHIAZIDE 25 MG PO TABS: 25.0000 mg | ORAL_TABLET | Freq: Every day | ORAL | Status: DC

## 2015-09-20 MED ORDER — LISINOPRIL 20 MG PO TABS: 20.0000 mg | ORAL_TABLET | Freq: Every day | ORAL | Status: DC

## 2015-09-20 MED ORDER — FUROSEMIDE 20 MG PO TABS: 20.0000 mg | ORAL_TABLET | Freq: Every day | ORAL | Status: DC

## 2015-09-20 NOTE — ED Provider Notes (Signed)
[facility] Emergency Department Provider Note  ____________________________________________  Time seen: Approximately 0023 AM  I have reviewed the triage vital signs and the nursing notes.   HISTORY  Chief Complaint Leg Swelling and Shortness of Breath    HPI Bianca Hill is a 39 y.o. female who comes into the hospital today with swelling. The patient reports that normally her feet and ankles are swollen but yesterday she noticed that her belly was getting hard and that she couldn't bend her legs as well. The patient reports that the swelling is mainly in her abdomen and in her legs. She reports that she tried to keep her legs up but has not helped. The patient denies any increase in her salt intake or change in her urination or fluid intake. The patient reports that she does not have a primary care physician as she lost her insurance. She is supposed to be taking lisinopril HCTZ for her blood pressure as well as fluid but she reports that she has not taken them in over a month and a half due to losing her insurance. The patient denies any chest pain denies any shortness of breath denies any back pain or pain in her legs. The patient was concerned due to the swelling so she decided to come in for evaluation.   Past Medical History  Diagnosis Date  . Diabetes mellitus without complication (HCC)   . Hypertension   . Hypercholesteremia     Patient Active Problem List   Diagnosis Date Noted  . IDDM (insulin dependent diabetes mellitus) (HCC) 02/09/2013    Past Surgical History  Procedure Laterality Date  . Refractive surgery      Current Outpatient Rx  Name  Route  Sig  Dispense  Refill  . furosemide (LASIX) 20 MG tablet   Oral   Take 1 tablet (20 mg total) by mouth daily.   3 tablet   0   . hydrochlorothiazide (HYDRODIURIL) 25 MG tablet   Oral   Take 1 tablet (25 mg total) by mouth daily.   30 tablet   0   . lisinopril (PRINIVIL,ZESTRIL) 20 MG  tablet   Oral   Take 1 tablet (20 mg total) by mouth daily.   30 tablet   0   . lisinopril-hydrochlorothiazide (PRINZIDE,ZESTORETIC) 20-25 MG tablet   Oral   Take 1 tablet by mouth daily.         . metFORMIN (GLUCOPHAGE) 500 MG tablet   Oral   Take 1 tablet (500 mg total) by mouth 2 (two) times daily with a meal.   60 tablet   0   . metoprolol (LOPRESSOR) 50 MG tablet   Oral   Take 1 tablet (50 mg total) by mouth 2 (two) times daily.   60 tablet   0   . simvastatin (ZOCOR) 20 MG tablet   Oral   Take 1 tablet (20 mg total) by mouth daily.   30 tablet   0     Allergies Review of patient's allergies indicates no known allergies.  No family history on file.  Social History Social History  Substance Use Topics  . Smoking status: Current Every Day Smoker -- 1.00 packs/day    Types: Cigarettes  . Smokeless tobacco: None  . Alcohol Use: No    Review of Systems Constitutional: No fever/chills Eyes: No visual changes. ENT: No sore throat. Cardiovascular: Denies chest pain. Respiratory: Denies shortness of breath. Gastrointestinal: No abdominal pain.  No nausea, no vomiting.  No diarrhea.  No constipation. Genitourinary: Negative for dysuria. Musculoskeletal: Negative for back pain. Skin: Negative for rash. Neurological: Negative for headaches, focal weakness or numbness. Hematological/Lymphatic:Bilateral lower extremity swelling  10-point ROS otherwise negative.  ____________________________________________   PHYSICAL EXAM:  VITAL SIGNS: ED Triage Vitals  Enc Vitals Group     BP 09/19/15 2238 206/114 mmHg     Pulse Rate 09/19/15 2238 80     Resp 09/19/15 2238 20     Temp 09/19/15 2238 97.7 F (36.5 C)     Temp Source 09/19/15 2238 Oral     SpO2 09/19/15 2238 99 %     Weight 09/19/15 2238 350 lb (158.759 kg)     Height 09/19/15 2238  (1.854 m)     Head Cir --      Peak Flow --      Pain Score 09/20/15 0001 0     Pain Loc --      Pain Edu?  --      Excl. in GC? --     Constitutional: Alert and oriented. Well appearing and in no acute distress. Eyes: Conjunctivae are normal. PERRL. EOMI. Head: Atraumatic. Nose: No congestion/rhinnorhea. Mouth/Throat: Mucous membranes are moist.  Oropharynx non-erythematous. Cardiovascular: Normal rate, regular rhythm. Grossly normal heart sounds.  Good peripheral circulation. Respiratory: Normal respiratory effort.  No retractions. Lungs CTAB. Gastrointestinal: Soft and nontender. No distention. Positive bowel sounds Musculoskeletal: Bilateral lower extremity edema.  No joint effusions. Neurologic:  Normal speech and language. Skin:  Skin is warm, dry and intact.  Psychiatric: Mood and affect are normal.   ____________________________________________   LABS (all labs ordered are listed, but only abnormal results are displayed)  Labs Reviewed  BRAIN NATRIURETIC PEPTIDE - Abnormal; Notable for the following:    B Natriuretic Peptide 128.0 (*)    All other components within normal limits  COMPREHENSIVE METABOLIC PANEL - Abnormal; Notable for the following:    Glucose, Bld 291 (*)    BUN 24 (*)    Creatinine, Ser 1.74 (*)    Calcium 8.1 (*)    Total Protein 4.9 (*)    Albumin 1.9 (*)    GFR calc non Af Amer 36 (*)    GFR calc Af Amer 42 (*)    Anion gap 4 (*)    All other components within normal limits  URINALYSIS COMPLETEWITH MICROSCOPIC ([facility] ONLY) - Abnormal; Notable for the following:    Color, Urine YELLOW (*)    APPearance HAZY (*)    Glucose, UA >500 (*)    Protein, ur >500 (*)    Bacteria, UA RARE (*)    Squamous Epithelial / LPF 0-5 (*)    All other components within normal limits  CBC WITH DIFFERENTIAL/PLATELET   ____________________________________________  EKG  ED ECG REPORT I, Rebecka Apley, the attending physician, personally viewed and interpreted this ECG.   Date: 09/19/2015  EKG Time: 2241  Rate: 77  Rhythm: normal sinus rhythm  Axis:  normal  Intervals:none  ST&T Change: none  ____________________________________________  RADIOLOGY  None ____________________________________________   PROCEDURES  Procedure(s) performed: None  Critical Care performed: No  ____________________________________________   INITIAL IMPRESSION / ASSESSMENT AND PLAN / ED COURSE  Pertinent labs & imaging results that were available during my care of the patient were reviewed by me and considered in my medical decision making (see chart for details).  This is a 39 year old female who comes into the hospital today with lower extremity swelling. The  patient typically would take a fluid pill but has not been taking her blood pressure medicine for the past month. The patient does have some elevation of her creatinine but is not severe compared to her previous creatinine levels. The patient's blood pressure was elevated during my evaluation so I gave her her dose of metoprolol, lisinopril, HCTZ as well as a dose of Lasix. I informed the patient initially that the plan was to check the protein in her urine but given her history it is very likely that she will have protein in her urine. I will discharge the patient home and [facility]. The patient reports that she has started a new job that she will not qualify for her insurance until March. I will also write the patient a prescription for her medications so that she continues to take her diabetes, blood pressure and cholesterol medications. ____________________________________________   FINAL CLINICAL IMPRESSION(S) / ED DIAGNOSES  Final diagnoses:  Peripheral edema  Secondary hypertension, unspecified      Rebecka Apley, MD 09/20/15 [id number]

## 2015-09-20 NOTE — Discharge Instructions (Signed)
Edema °Edema is an abnormal buildup of fluids in your body tissues. Edema is somewhat dependent on gravity to pull the fluid to the lowest place in your body. That makes the condition more common in the legs and thighs (lower extremities). Painless swelling of the feet and ankles is common and becomes more likely as you get older. It is also common in looser tissues, like around your eyes.  °When the affected area is squeezed, the fluid may move out of that spot and leave a dent for a few moments. This dent is called pitting.  °CAUSES  °There are many possible causes of edema. Eating too much salt and being on your feet or sitting for a long time can cause edema in your legs and ankles. Hot weather may make edema worse. Common medical causes of edema include: °· Heart failure. °· Liver disease. °· Kidney disease. °· Weak blood vessels in your legs. °· Cancer. °· An injury. °· Pregnancy. °· Some medications. °· Obesity.  °SYMPTOMS  °Edema is usually painless. Your skin may look swollen or shiny.  °DIAGNOSIS  °Your health care provider may be able to diagnose edema by asking about your medical history and doing a physical exam. You may need to have tests such as X-rays, an electrocardiogram, or blood tests to check for medical conditions that may cause edema.  °TREATMENT  °Edema treatment depends on the cause. If you have heart, liver, or kidney disease, you need the treatment appropriate for these conditions. General treatment may include: °· Elevation of the affected body part above the level of your heart. °· Compression of the affected body part. Pressure from elastic bandages or support stockings squeezes the tissues and forces fluid back into the blood vessels. This keeps fluid from entering the tissues. °· Restriction of fluid and salt intake. °· Use of a water pill (diuretic). These medications are appropriate only for some types of edema. They pull fluid out of your body and make you urinate more often. This  gets rid of fluid and reduces swelling, but diuretics can have side effects. Only use diuretics as directed by your health care provider. °HOME CARE INSTRUCTIONS  °· Keep the affected body part above the level of your heart when you are lying down.   °· Do not sit still or stand for prolonged periods.   °· Do not put anything directly under your knees when lying down. °· Do not wear constricting clothing or garters on your upper legs.   °· Exercise your legs to work the fluid back into your blood vessels. This may help the swelling go down.   °· Wear elastic bandages or support stockings to reduce ankle swelling as directed by your health care provider.   °· Eat a low-salt diet to reduce fluid if your health care provider recommends it.   °· Only take medicines as directed by your health care provider.  °SEEK MEDICAL CARE IF:  °· Your edema is not responding to treatment. °· You have heart, liver, or kidney disease and notice symptoms of edema. °· You have edema in your legs that does not improve after elevating them.   °· You have sudden and unexplained weight gain. °SEEK IMMEDIATE MEDICAL CARE IF:  °· You develop shortness of breath or chest pain.   °· You cannot breathe when you lie down. °· You develop pain, redness, or warmth in the swollen areas.   °· You have heart, liver, or kidney disease and suddenly get edema. °· You have a fever and your symptoms suddenly get worse. °MAKE SURE YOU:  °·   Understand these instructions. °· Will watch your condition. °· Will get help right away if you are not doing well or get worse. °  °This information is not intended to replace advice given to you by your health care provider. Make sure you discuss any questions you have with your health care provider. °  °Document Released: 09/17/2005 Document Revised: 10/08/2014 Document Reviewed: 07/10/2013 °Elsevier Interactive Patient Education ©2016 Else[employer] Inc. ° °Hypertension °Hypertension, commonly called high blood pressure, is  when the force of blood pumping through your arteries is too strong. Your arteries are the blood vessels that carry blood from your heart throughout your body. A blood pressure reading consists of a higher number over a lower number, such as 110/72. The higher number (systolic) is the pressure inside your arteries when your heart pumps. The lower number (diastolic) is the pressure inside your arteries when your heart relaxes. Ideally you want your blood pressure below 120/80. °Hypertension forces your heart to work harder to pump blood. Your arteries may become narrow or stiff. Having untreated or uncontrolled hypertension can cause heart attack, stroke, kidney disease, and other problems. °RISK FACTORS °Some risk factors for high blood pressure are controllable. Others are not.  °Risk factors you cannot control include:  °· Race. You may be at higher risk if you are African American. °· Age. Risk increases with age. °· Gender. Men are at higher risk than women before age 45 years. After age 65, women are at higher risk than men. °Risk factors you can control include: °· Not getting enough exercise or physical activity. °· Being overweight. °· Getting too much fat, sugar, calories, or salt in your diet. °· Drinking too much alcohol. °SIGNS AND SYMPTOMS °Hypertension does not usually cause signs or symptoms. Extremely high blood pressure (hypertensive crisis) may cause headache, anxiety, shortness of breath, and nosebleed. °DIAGNOSIS °To check if you have hypertension, your health care provider will measure your blood pressure while you are seated, with your arm held at the level of your heart. It should be measured at least twice using the same arm. Certain conditions can cause a difference in blood pressure between your right and left arms. A blood pressure reading that is higher than normal on one occasion does not mean that you need treatment. If it is not clear whether you have high blood pressure, you may be  asked to return on a different day to have your blood pressure checked again. Or, you may be asked to monitor your blood pressure at home for 1 or more weeks. °TREATMENT °Treating high blood pressure includes making lifestyle changes and possibly taking medicine. Living a healthy lifestyle can help lower high blood pressure. You may need to change some of your habits. °Lifestyle changes may include: °· Following the DASH diet. This diet is high in fruits, vegetables, and whole grains. It is low in salt, red meat, and added sugars. °· Keep your sodium intake below 2,300 mg per day. °· Getting at least 30-45 minutes of aerobic exercise at least 4 times per week. °· Losing weight if necessary. °· Not smoking. °· Limiting alcoholic beverages. °· Learning ways to reduce stress. °Your health care provider may prescribe medicine if lifestyle changes are not enough to get your blood pressure under control, and if one of the following is true: °· You are 18-59 years of age and your systolic blood pressure is above 140. °· You are 60 years of age or older, and your systolic blood pressure is   above 150.  Your diastolic blood pressure is above 90.  You have diabetes, and your systolic blood pressure is over 140 or your diastolic blood pressure is over 90.  You have kidney disease and your blood pressure is above 140/90.  You have heart disease and your blood pressure is above 140/90. Your personal target blood pressure may vary depending on your medical conditions, your age, and other factors. HOME CARE INSTRUCTIONS  Have your blood pressure rechecked as directed by your health care provider.   Take medicines only as directed by your health care provider. Follow the directions carefully. Blood pressure medicines must be taken as prescribed. The medicine does not work as well when you skip doses. Skipping doses also puts you at risk for problems.  Do not smoke.   Monitor your blood pressure at home as  directed by your health care provider. SEEK MEDICAL CARE IF:   You think you are having a reaction to medicines taken.  You have recurrent headaches or feel dizzy.  You have swelling in your ankles.  You have trouble with your vision. SEEK IMMEDIATE MEDICAL CARE IF:  You develop a severe headache or confusion.  You have unusual weakness, numbness, or feel faint.  You have severe chest or abdominal pain.  You vomit repeatedly.  You have trouble breathing. MAKE SURE YOU:   Understand these instructions.  Will watch your condition.  Will get help right away if you are not doing well or get worse.   This information is not intended to replace advice given to you by your health care provider. Make sure you discuss any questions you have with your health care provider.   Document Released: 09/17/2005 Document Revised: 02/01/2015 Document Reviewed: 07/10/2013 Elsevier Interactive Patient Education 2016 Elsevier Inc.  Peripheral Edema You have swelling in your legs (peripheral edema). This swelling is due to excess accumulation of salt and water in your body. Edema may be a sign of heart, kidney or liver disease, or a side effect of a medication. It may also be due to problems in the leg veins. Elevating your legs and using special support stockings may be very helpful, if the cause of the swelling is due to poor venous circulation. Avoid long periods of standing, whatever the cause. Treatment of edema depends on identifying the cause. Chips, pretzels, pickles and other salty foods should be avoided. Restricting salt in your diet is almost always needed. Water pills (diuretics) are often used to remove the excess salt and water from your body via urine. These medicines prevent the kidney from reabsorbing sodium. This increases urine flow. Diuretic treatment may also result in lowering of potassium levels in your body. Potassium supplements may be needed if you have to use diuretics  daily. Daily weights can help you keep track of your progress in clearing your edema. You should call your caregiver for follow up care as recommended. SEEK IMMEDIATE MEDICAL CARE IF:   You have increased swelling, pain, redness, or heat in your legs.  You develop shortness of breath, especially when lying down.  You develop chest or abdominal pain, weakness, or fainting.  You have a fever.   This information is not intended to replace advice given to you by your health care provider. Make sure you discuss any questions you have with your health care provider.   Document Released: 10/25/2004 Document Revised: 12/10/2011 Document Reviewed: 03/30/2015 Elsevier Interactive Patient Education [employer]2016 Elsevier Inc.

## 2015-09-22 ENCOUNTER — Emergency Department
Admission: EM | Admit: 2015-09-22 | Discharge: 2015-09-22 | Disposition: A | Discharge status: Home / Self Care | Payer: Self-pay | Attending: Emergency Medicine | Admitting: Emergency Medicine

## 2015-09-22 ENCOUNTER — Emergency Department: Payer: Self-pay

## 2015-09-22 DIAGNOSIS — J159 Unspecified bacterial pneumonia: Secondary | ICD-10-CM | POA: Insufficient documentation

## 2015-09-22 DIAGNOSIS — Z79899 Other long term (current) drug therapy: Secondary | ICD-10-CM | POA: Insufficient documentation

## 2015-09-22 DIAGNOSIS — J189 Pneumonia, unspecified organism: Secondary | ICD-10-CM

## 2015-09-22 DIAGNOSIS — I10 Essential (primary) hypertension: Secondary | ICD-10-CM | POA: Insufficient documentation

## 2015-09-22 DIAGNOSIS — F17210 Nicotine dependence, cigarettes, uncomplicated: Secondary | ICD-10-CM | POA: Insufficient documentation

## 2015-09-22 DIAGNOSIS — E119 Type 2 diabetes mellitus without complications: Secondary | ICD-10-CM | POA: Insufficient documentation

## 2015-09-22 DIAGNOSIS — Z7984 Long term (current) use of oral hypoglycemic drugs: Secondary | ICD-10-CM | POA: Insufficient documentation

## 2015-09-22 LAB — CBC WITH DIFFERENTIAL/PLATELET
Basophils Absolute: 0.1 10*3/uL (ref 0–0.1)
Basophils Relative: 1 %
EOS ABS: 0.1 10*3/uL (ref 0–0.7)
EOS PCT: 2 %
HCT: 42.7 % (ref 35.0–47.0)
Hemoglobin: 13.9 g/dL (ref 12.0–16.0)
LYMPHS ABS: 1 10*3/uL (ref 1.0–3.6)
LYMPHS PCT: 19 %
MCH: 30.3 pg (ref 26.0–34.0)
MCHC: 32.4 g/dL (ref 32.0–36.0)
MCV: 93.4 fL (ref 80.0–100.0)
MONO ABS: 0.7 10*3/uL (ref 0.2–0.9)
MONOS PCT: 14 %
Neutro Abs: 3.2 10*3/uL (ref 1.4–6.5)
Neutrophils Relative %: 64 %
PLATELETS: 175 10*3/uL (ref 150–440)
RBC: 4.58 MIL/uL (ref 3.80–5.20)
RDW: 14 % (ref 11.5–14.5)
WBC: 5.1 10*3/uL (ref 3.6–11.0)

## 2015-09-22 LAB — COMPREHENSIVE METABOLIC PANEL
ALT: 26 U/L (ref 14–54)
ANION GAP: 5 (ref 5–15)
AST: 30 U/L (ref 15–41)
Albumin: 2.1 g/dL — ABNORMAL LOW (ref 3.5–5.0)
Alkaline Phosphatase: 70 U/L (ref 38–126)
BUN: 24 mg/dL — ABNORMAL HIGH (ref 6–20)
CHLORIDE: 103 mmol/L (ref 101–111)
CO2: 24 mmol/L (ref 22–32)
CREATININE: 1.86 mg/dL — AB (ref 0.44–1.00)
Calcium: 8.5 mg/dL — ABNORMAL LOW (ref 8.9–10.3)
GFR, EST AFRICAN AMERICAN: 39 mL/min — AB (ref >60)
GFR, EST NON AFRICAN AMERICAN: 33 mL/min — AB (ref >60)
Glucose, Bld: 199 mg/dL — ABNORMAL HIGH (ref 65–99)
POTASSIUM: 3.8 mmol/L (ref 3.5–5.1)
SODIUM: 132 mmol/L — AB (ref 135–145)
Total Bilirubin: 0.4 mg/dL (ref 0.3–1.2)
Total Protein: 5.5 g/dL — ABNORMAL LOW (ref 6.5–8.1)

## 2015-09-22 MED ORDER — ALBUTEROL SULFATE HFA 108 (90 BASE) MCG/ACT IN AERS: 2.0000 | INHALATION_SPRAY | Freq: Four times a day (QID) | RESPIRATORY_TRACT | Status: AC | PRN

## 2015-09-22 MED ORDER — AZITHROMYCIN 500 MG PO TABS: 500.0000 mg | ORAL_TABLET | Freq: Every day | ORAL | Status: DC

## 2015-09-22 MED ORDER — IPRATROPIUM-ALBUTEROL 0.5-2.5 (3) MG/3ML IN SOLN
3.0000 mL | Freq: Once | RESPIRATORY_TRACT | Status: AC
  Administered 2015-09-22: 3 mL via RESPIRATORY_TRACT
  Filled 2015-09-22: qty 3

## 2015-09-22 MED ORDER — AMOXICILLIN 500 MG PO TABS: 500.0000 mg | ORAL_TABLET | Freq: Two times a day (BID) | ORAL | Status: DC

## 2015-09-22 MED ORDER — AZITHROMYCIN 250 MG PO TABS
500.0000 mg | ORAL_TABLET | Freq: Once | ORAL | Status: AC
  Administered 2015-09-22: 500 mg via ORAL
  Filled 2015-09-22: qty 2

## 2015-09-22 MED ORDER — DEXTROSE 5 % IV SOLN
2.0000 g | Freq: Once | INTRAVENOUS | Status: AC
  Administered 2015-09-22: 2 g via INTRAVENOUS
  Filled 2015-09-22: qty 2

## 2015-09-22 NOTE — ED Notes (Signed)
Pt states that she is experiencing SOB since yesterday. SOB with exertion only. Pt seen in ER for "swelling' X 3 days ago per patient report. Pt alert and oriented X4, active, cooperative, pt in NAD. RR even and unlabored, color WNL.

## 2015-09-22 NOTE — Care Management Note (Signed)
Case Management Note  Patient Details  Name: Bianca Hill MRN: [id number] Date of Birth: [birth year]  Subjective/Objective:   [facility]aw patient and [facility] and instructions for same. Patient has no questions at this time.                 Action/Plan:   Expected Discharge Date:                  Expected Discharge Plan:     In-House Referral:     Discharge planning Services     Post Acute Care Choice:    Choice offered to:     DME Arranged:    DME Agency:     HH Arranged:    HH Agency:     Status of Service:     Medicare Important Message Given:    Date Medicare IM Given:    Medicare IM give by:    Date Additional Medicare IM Given:    Additional Medicare Important Message give by:     If discussed at Long Length of Stay Meetings, dates discussed:    Additional Comments:  Bianca BueCheryl Romuald Mccaslin, RN 09/22/2015, 10:41 AM

## 2015-09-22 NOTE — Discharge Instructions (Signed)
Take azithromycin for tumor days. This will give you 10-14 days worth of antibiotic coverage. He may also take amoxicillin. These 2 antibiotics together give you very broad coverage for the pneumonia you currently have. If he have more shortness of breath, use the inhaler prescribed. Return the emergency department if you have worsening shortness of breath, higher fevers, or other urgent concerns.  Community-Acquired Pneumonia, Adult Pneumonia is an infection of the lungs. One type of pneumonia can happen while a person is in a hospital. A different type can happen when a person is not in a hospital (community-acquired pneumonia). It is easy for this kind to spread from person to person. It can spread to you if you breathe near an infected person who coughs or sneezes. Some symptoms include:  A dry cough.  A wet (productive) cough.  Fever.  Sweating.  Chest pain. HOME CARE  Take over-the-counter and prescription medicines only as told by your doctor.  Only take cough medicine if you are losing sleep.  If you were prescribed an antibiotic medicine, take it as told by your doctor. Do not stop taking the antibiotic even if you start to feel better.  Sleep with your head and neck raised (elevated). You can do this by putting a few pillows under your head, or you can sleep in a recliner.  Do not use tobacco products. These include cigarettes, chewing tobacco, and e-cigarettes. If you need help quitting, ask your doctor.  Drink enough water to keep your pee (urine) clear or pale yellow. A shot (vaccine) can help prevent pneumonia. Shots are often suggested for:  People older than 38 years of age.  People older than 38 years of age:  Who are having cancer treatment.  Who have long-term (chronic) lung disease.  Who have problems with their body's defense system (immune system). You may also prevent pneumonia if you take these actions:  Get the flu (influenza) shot every year.  Go to  the dentist as often as told.  Wash your hands often. If soap and water are not available, use hand sanitizer. GET HELP IF:  You have a fever.  You lose sleep because your cough medicine does not help. GET HELP RIGHT AWAY IF:  You are short of breath and it gets worse.  You have more chest pain.  Your sickness gets worse. This is very serious if:  You are an older adult.  Your body's defense system is weak.  You cough up blood.   This information is not intended to replace advice given to you by your health care provider. Make sure you discuss any questions you have with your health care provider.   Document Released: 03/05/2008 Document Revised: 06/08/2015 Document Reviewed: 01/12/2015 Elsevier Interactive Patient Education [employer]2016 Elsevier Inc.

## 2015-09-22 NOTE — ED Provider Notes (Signed)
[facility] Emergency Department Provider Note  ____________________________________________  Time seen: 1020  I have reviewed the triage vital signs and the nursing notes.  History by:  Patient  HISTORY  Chief Complaint Shortness of Breath     HPI Bianca Hill is a 39 y.o. female who presents emergency Department with shortness of breath. She reports she has history of prior pneumonia and has felt like this before. She was seen in the emergency department recently due to mild increased swelling in both legs. She was initiated on some Lasix.   Now, the patient has shortness of breath. This worsened overnight and this morning. She denies any fever, but reports that she had pneumonia last, which she describes as "walking pneumonia" she did not have a fever either.  She denies any abdominal pain or nausea or diarrhea. She denies any chest pain.    Past Medical History  Diagnosis Date  . Diabetes mellitus without complication (HCC)   . Hypertension   . Hypercholesteremia     Patient Active Problem List   Diagnosis Date Noted  . IDDM (insulin dependent diabetes mellitus) (HCC) 02/09/2013    Past Surgical History  Procedure Laterality Date  . Refractive surgery      Current Outpatient Rx  Name  Route  Sig  Dispense  Refill  . furosemide (LASIX) 20 MG tablet   Oral   Take 1 tablet (20 mg total) by mouth daily.   3 tablet   0   . lisinopril-hydrochlorothiazide (PRINZIDE,ZESTORETIC) 20-25 MG tablet   Oral   Take 1 tablet by mouth daily.         . metFORMIN (GLUCOPHAGE) 500 MG tablet   Oral   Take 1 tablet (500 mg total) by mouth 2 (two) times daily with a meal.   60 tablet   0   . metoprolol (LOPRESSOR) 50 MG tablet   Oral   Take 1 tablet (50 mg total) by mouth 2 (two) times daily.   60 tablet   0   . simvastatin (ZOCOR) 20 MG tablet   Oral   Take 1 tablet (20 mg total) by mouth daily.   30 tablet   0   . albuterol (PROVENTIL  HFA;VENTOLIN HFA) 108 (90 BASE) MCG/ACT inhaler   Inhalation   Inhale 2 puffs into the lungs every 6 (six) hours as needed for wheezing or shortness of breath.   1 Inhaler   2   . amoxicillin (AMOXIL) 500 MG tablet   Oral   Take 1 tablet (500 mg total) by mouth 2 (two) times daily.   14 tablet   0   . azithromycin (ZITHROMAX) 500 MG tablet   Oral   Take 1 tablet (500 mg total) by mouth daily. Take 1 tablet daily for 3 days.   20 tablet   0   . hydrochlorothiazide (HYDRODIURIL) 25 MG tablet   Oral   Take 1 tablet (25 mg total) by mouth daily.   30 tablet   0   . lisinopril (PRINIVIL,ZESTRIL) 20 MG tablet   Oral   Take 1 tablet (20 mg total) by mouth daily.   30 tablet   0     Allergies Review of patient's allergies indicates no known allergies.  No family history on file.  Social History Social History  Substance Use Topics  . Smoking status: Current Every Day Smoker -- 1.00 packs/day    Types: Cigarettes  . Smokeless tobacco: None  . Alcohol Use: No  Review of Systems  Constitutional: Negative for fever/chills. ENT: Negative for congestion. Cardiovascular: Negative for chest pain. Respiratory: Positive for shortness of breath. Gastrointestinal: Negative for abdominal pain, vomiting and diarrhea. Genitourinary: Negative for dysuria. Musculoskeletal: No back pain. Skin: Negative for rash. Neurological: Negative for headache or focal weakness   10-point ROS otherwise negative.  ____________________________________________   PHYSICAL EXAM:  VITAL SIGNS: ED Triage Vitals  Enc Vitals Group     BP 09/22/15 0918 201/88 mmHg     Pulse Rate 09/22/15 0918 75     Resp 09/22/15 0918 22     Temp 09/22/15 0918 98.1 F (36.7 C)     Temp Source 09/22/15 0918 Oral     SpO2 09/22/15 0918 93 %     Weight 09/22/15 0918 350 lb (158.759 kg)     Height 09/22/15 0918  (1.676 m)     Head Cir --      Peak Flow --      Pain Score --      Pain Loc --       Pain Edu? --      Excl. in GC? --     Constitutional: Alert and oriented. Large body habitus. Mild increase work of breathing, but no acute distress. ENT   Head: Normocephalic and atraumatic.   Nose: No congestion/rhinnorhea.       Mouth: No erythema, no swelling   Cardiovascular: Normal rate at 80, regular rhythm, no murmur noted Respiratory:  Normal respiratory effort, mild tachypnea.    Breath sounds are clear and equal bilaterally.  Gastrointestinal: Soft, no distention. Nontender Back: No muscle spasm, no tenderness, no CVA tenderness. Musculoskeletal: No deformity noted. Nontender with normal range of motion in all extremities.  Chronic appearing edema in both legs, likely lymphedema.  Neurologic:  Communicative. Normal appearing spontaneous movement in all 4 extremities. No gross focal neurologic deficits are appreciated.  Skin:  Skin is warm, dry. No rash noted. Psychiatric: Mood and affect are normal. Speech and behavior are normal.  ____________________________________________    LABS (pertinent positives/negatives)  Labs Reviewed  COMPREHENSIVE METABOLIC PANEL - Abnormal; Notable for the following:    Sodium 132 (*)    Glucose, Bld 199 (*)    BUN 24 (*)    Creatinine, Ser 1.86 (*)    Calcium 8.5 (*)    Total Protein 5.5 (*)    Albumin 2.1 (*)    GFR calc non Af Amer 33 (*)    GFR calc Af Amer 39 (*)    All other components within normal limits  CULTURE, BLOOD (ROUTINE X 2)  CULTURE, BLOOD (ROUTINE X 2)  CBC WITH DIFFERENTIAL/PLATELET     ____________________________________________   EKG  ED ECG REPORT I, Brae Schaafsma W, the attending physician, personally viewed and interpreted this ECG.   Date: 09/22/2015  EKG Time: 924  Rate: 74  Rhythm: Normal sinus rhythm  Axis: Right axis at 95  Intervals: Normal  ST&T Change: None noted   ____________________________________________    RADIOLOGY  Chest x-ray: IMPRESSION: 1. Increase in  interstitial edema. 2. New right lower lobe pneumonia.   ____________________________________________   PROCEDURES   ____________________________________________   INITIAL IMPRESSION / ASSESSMENT AND PLAN / ED COURSE  Pertinent labs & imaging results that were available during my care of the patient were reviewed by me and considered in my medical decision making (see chart for details).  Pleasant 39 year old female with shortness of breath consistent with prior pneumonia. Chest x-ray does show  a pneumonia in the right lower lobe. We will initiate antibiotics. We will treat her with a DuoNeb to try to maximize pulmonary function. Labs are pending. We will reexamine and decide then whether the patient requires admission or may be treated as an outpatient.  ----------------------------------------- 12:03 PM on 09/22/2015 -----------------------------------------  Patient is satting well on room air at 94-95%. She still has a little bit of coarse breath sounds. She has been treated with azithromycin and ceftriaxone is currently pending. Her white blood cell count is 5.1 thousand.   We will continue to observe at this time. If the respiratory noise improves and her breathing is stable, she could be discharged home. If it does not and she continues to need additional support, we will seek admission to the hospital.  ----------------------------------------- 2:51 PM on 09/22/2015 -----------------------------------------  Patient looks well at this time. She is sitting upright, has 100% O2 sat, eating and drinking, watching TV. She is in no acute distress. She reports she feels better. She is appreciative of the care she received.  I will continue her on azithromycin as well as amoxicillin. Will also prescribe an albuterol inhaler to be used as needed.  ____________________________________________   FINAL CLINICAL IMPRESSION(S) / ED DIAGNOSES  Final diagnoses:  Community acquired  pneumonia   right lower lobe    Darien Ramus, MD 09/22/15 [phone]

## 2015-09-24 ENCOUNTER — Inpatient Hospital Stay
Admission: EM | Admit: 2015-09-24 | Discharge: 2015-09-27 | DRG: 190 | Discharge status: Against Medical Advice | Payer: Self-pay | Attending: Internal Medicine | Admitting: Internal Medicine

## 2015-09-24 ENCOUNTER — Encounter: Payer: Self-pay | Admitting: Emergency Medicine

## 2015-09-24 ENCOUNTER — Emergency Department: Payer: Self-pay

## 2015-09-24 DIAGNOSIS — Z6841 Body Mass Index (BMI) 40.0 and over, adult: Secondary | ICD-10-CM

## 2015-09-24 DIAGNOSIS — E785 Hyperlipidemia, unspecified: Secondary | ICD-10-CM | POA: Diagnosis present

## 2015-09-24 DIAGNOSIS — Z23 Encounter for immunization: Secondary | ICD-10-CM

## 2015-09-24 DIAGNOSIS — I10 Essential (primary) hypertension: Secondary | ICD-10-CM | POA: Diagnosis present

## 2015-09-24 DIAGNOSIS — Z7951 Long term (current) use of inhaled steroids: Secondary | ICD-10-CM

## 2015-09-24 DIAGNOSIS — Y95 Nosocomial condition: Secondary | ICD-10-CM | POA: Diagnosis present

## 2015-09-24 DIAGNOSIS — Z794 Long term (current) use of insulin: Secondary | ICD-10-CM

## 2015-09-24 DIAGNOSIS — IMO0001 Reserved for inherently not codable concepts without codable children: Secondary | ICD-10-CM

## 2015-09-24 DIAGNOSIS — R0602 Shortness of breath: Secondary | ICD-10-CM

## 2015-09-24 DIAGNOSIS — E7800 Pure hypercholesterolemia, unspecified: Secondary | ICD-10-CM | POA: Diagnosis present

## 2015-09-24 DIAGNOSIS — J441 Chronic obstructive pulmonary disease with (acute) exacerbation: Secondary | ICD-10-CM | POA: Diagnosis present

## 2015-09-24 DIAGNOSIS — J440 Chronic obstructive pulmonary disease with acute lower respiratory infection: Principal | ICD-10-CM | POA: Diagnosis present

## 2015-09-24 DIAGNOSIS — J9600 Acute respiratory failure, unspecified whether with hypoxia or hypercapnia: Secondary | ICD-10-CM | POA: Diagnosis present

## 2015-09-24 DIAGNOSIS — E8770 Fluid overload, unspecified: Secondary | ICD-10-CM | POA: Diagnosis present

## 2015-09-24 DIAGNOSIS — N179 Acute kidney failure, unspecified: Secondary | ICD-10-CM | POA: Diagnosis present

## 2015-09-24 DIAGNOSIS — F17210 Nicotine dependence, cigarettes, uncomplicated: Secondary | ICD-10-CM | POA: Diagnosis present

## 2015-09-24 DIAGNOSIS — J189 Pneumonia, unspecified organism: Secondary | ICD-10-CM | POA: Diagnosis present

## 2015-09-24 DIAGNOSIS — Z7984 Long term (current) use of oral hypoglycemic drugs: Secondary | ICD-10-CM

## 2015-09-24 DIAGNOSIS — E119 Type 2 diabetes mellitus without complications: Secondary | ICD-10-CM | POA: Diagnosis present

## 2015-09-24 DIAGNOSIS — Z79899 Other long term (current) drug therapy: Secondary | ICD-10-CM

## 2015-09-24 LAB — COMPREHENSIVE METABOLIC PANEL
ALK PHOS: 72 U/L (ref 38–126)
ALT: 30 U/L (ref 14–54)
ANION GAP: 5 (ref 5–15)
AST: 33 U/L (ref 15–41)
Albumin: 1.9 g/dL — ABNORMAL LOW (ref 3.5–5.0)
BUN: 31 mg/dL — ABNORMAL HIGH (ref 6–20)
CALCIUM: 8.1 mg/dL — AB (ref 8.9–10.3)
CO2: 26 mmol/L (ref 22–32)
CREATININE: 1.79 mg/dL — AB (ref 0.44–1.00)
Chloride: 104 mmol/L (ref 101–111)
GFR, EST AFRICAN AMERICAN: 40 mL/min — AB (ref >60)
GFR, EST NON AFRICAN AMERICAN: 35 mL/min — AB (ref >60)
Glucose, Bld: 240 mg/dL — ABNORMAL HIGH (ref 65–99)
Potassium: 4 mmol/L (ref 3.5–5.1)
Sodium: 135 mmol/L (ref 135–145)
Total Bilirubin: 0.5 mg/dL (ref 0.3–1.2)
Total Protein: 5.4 g/dL — ABNORMAL LOW (ref 6.5–8.1)

## 2015-09-24 LAB — CBC
HCT: 43.2 % (ref 35.0–47.0)
HEMOGLOBIN: 14.5 g/dL (ref 12.0–16.0)
MCH: 30.7 pg (ref 26.0–34.0)
MCHC: 33.5 g/dL (ref 32.0–36.0)
MCV: 91.6 fL (ref 80.0–100.0)
PLATELETS: 171 10*3/uL (ref 150–440)
RBC: 4.71 MIL/uL (ref 3.80–5.20)
RDW: 13.5 % (ref 11.5–14.5)
WBC: 7.4 10*3/uL (ref 3.6–11.0)

## 2015-09-24 LAB — BRAIN NATRIURETIC PEPTIDE: B NATRIURETIC PEPTIDE 5: 153 pg/mL — AB (ref 0.0–100.0)

## 2015-09-24 LAB — GLUCOSE, CAPILLARY: GLUCOSE-CAPILLARY: 213 mg/dL — AB (ref 65–99)

## 2015-09-24 LAB — POCT PREGNANCY, URINE: Preg Test, Ur: NEGATIVE

## 2015-09-24 LAB — TROPONIN I

## 2015-09-24 MED ORDER — DEXTROSE 5 % IV SOLN
1.0000 g | Freq: Three times a day (TID) | INTRAVENOUS | Status: DC
  Administered 2015-09-25 (×2): 1 g via INTRAVENOUS
  Filled 2015-09-24 (×4): qty 1

## 2015-09-24 MED ORDER — ALBUTEROL SULFATE (2.5 MG/3ML) 0.083% IN NEBU: 2.5000 mg | INHALATION_SOLUTION | RESPIRATORY_TRACT | Status: DC | PRN

## 2015-09-24 MED ORDER — LISINOPRIL 20 MG PO TABS
20.0000 mg | ORAL_TABLET | Freq: Every day | ORAL | Status: DC
  Administered 2015-09-25 – 2015-09-26 (×2): 20 mg via ORAL
  Filled 2015-09-24 (×2): qty 1

## 2015-09-24 MED ORDER — LEVOFLOXACIN IN D5W 750 MG/150ML IV SOLN
750.0000 mg | Freq: Once | INTRAVENOUS | Status: AC
  Administered 2015-09-24: 750 mg via INTRAVENOUS
  Filled 2015-09-24: qty 150

## 2015-09-24 MED ORDER — ALBUTEROL SULFATE (2.5 MG/3ML) 0.083% IN NEBU
5.0000 mg | INHALATION_SOLUTION | Freq: Once | RESPIRATORY_TRACT | Status: AC
  Administered 2015-09-24: 5 mg via RESPIRATORY_TRACT
  Filled 2015-09-24: qty 6

## 2015-09-24 MED ORDER — HYDROCHLOROTHIAZIDE 25 MG PO TABS
25.0000 mg | ORAL_TABLET | Freq: Every day | ORAL | Status: DC
  Administered 2015-09-25: 25 mg via ORAL
  Filled 2015-09-24: qty 1

## 2015-09-24 MED ORDER — PIPERACILLIN-TAZOBACTAM 3.375 G IVPB 30 MIN: 3.3750 g | Freq: Three times a day (TID) | INTRAVENOUS | Status: DC

## 2015-09-24 MED ORDER — METOPROLOL TARTRATE 1 MG/ML IV SOLN
5.0000 mg | INTRAVENOUS | Status: DC | PRN
  Administered 2015-09-25 – 2015-09-27 (×3): 5 mg via INTRAVENOUS
  Filled 2015-09-24 (×3): qty 5

## 2015-09-24 MED ORDER — METOPROLOL TARTRATE 50 MG PO TABS
50.0000 mg | ORAL_TABLET | Freq: Two times a day (BID) | ORAL | Status: DC
  Administered 2015-09-25 – 2015-09-26 (×4): 50 mg via ORAL
  Filled 2015-09-24 (×4): qty 1

## 2015-09-24 MED ORDER — SIMVASTATIN 20 MG PO TABS
20.0000 mg | ORAL_TABLET | Freq: Every day | ORAL | Status: DC
  Administered 2015-09-25 – 2015-09-26 (×3): 20 mg via ORAL
  Filled 2015-09-24 (×3): qty 1

## 2015-09-24 MED ORDER — INSULIN ASPART 100 UNIT/ML ~~LOC~~ SOLN
0.0000 [IU] | Freq: Every day | SUBCUTANEOUS | Status: DC
  Administered 2015-09-25: 2 [IU] via SUBCUTANEOUS
  Administered 2015-09-25: 3 [IU] via SUBCUTANEOUS
  Administered 2015-09-26: 5 [IU] via SUBCUTANEOUS
  Filled 2015-09-24: qty 3
  Filled 2015-09-24: qty 2
  Filled 2015-09-24: qty 5

## 2015-09-24 MED ORDER — FUROSEMIDE 20 MG PO TABS
20.0000 mg | ORAL_TABLET | Freq: Every day | ORAL | Status: DC
  Administered 2015-09-25: 20 mg via ORAL
  Filled 2015-09-24: qty 1

## 2015-09-24 MED ORDER — ENOXAPARIN SODIUM 40 MG/0.4ML ~~LOC~~ SOLN
40.0000 mg | Freq: Two times a day (BID) | SUBCUTANEOUS | Status: DC
  Administered 2015-09-25 – 2015-09-27 (×6): 40 mg via SUBCUTANEOUS
  Filled 2015-09-24 (×6): qty 0.4

## 2015-09-24 MED ORDER — METHYLPREDNISOLONE SODIUM SUCC 125 MG IJ SOLR
125.0000 mg | INTRAMUSCULAR | Status: AC
  Administered 2015-09-24: 125 mg via INTRAVENOUS
  Filled 2015-09-24: qty 2

## 2015-09-24 MED ORDER — IPRATROPIUM-ALBUTEROL 0.5-2.5 (3) MG/3ML IN SOLN
3.0000 mL | Freq: Four times a day (QID) | RESPIRATORY_TRACT | Status: DC
  Administered 2015-09-24 – 2015-09-27 (×11): 3 mL via RESPIRATORY_TRACT
  Filled 2015-09-24 (×12): qty 3

## 2015-09-24 MED ORDER — NITROGLYCERIN 2 % TD OINT: 1.0000 [in_us] | TOPICAL_OINTMENT | Freq: Once | TRANSDERMAL | Status: DC

## 2015-09-24 MED ORDER — FUROSEMIDE 10 MG/ML IJ SOLN
40.0000 mg | Freq: Once | INTRAMUSCULAR | Status: AC
  Administered 2015-09-24: 40 mg via INTRAVENOUS
  Filled 2015-09-24: qty 4

## 2015-09-24 MED ORDER — METOPROLOL TARTRATE 50 MG PO TABS
50.0000 mg | ORAL_TABLET | ORAL | Status: AC
  Administered 2015-09-24: 50 mg via ORAL
  Filled 2015-09-24: qty 1

## 2015-09-24 MED ORDER — INSULIN ASPART 100 UNIT/ML ~~LOC~~ SOLN
0.0000 [IU] | Freq: Three times a day (TID) | SUBCUTANEOUS | Status: DC
  Administered 2015-09-25 (×3): 11 [IU] via SUBCUTANEOUS
  Administered 2015-09-26 (×2): 8 [IU] via SUBCUTANEOUS
  Administered 2015-09-26: 15 [IU] via SUBCUTANEOUS
  Administered 2015-09-27: 5 [IU] via SUBCUTANEOUS
  Filled 2015-09-24: qty 8
  Filled 2015-09-24: qty 15
  Filled 2015-09-24: qty 5
  Filled 2015-09-24: qty 11
  Filled 2015-09-24: qty 8
  Filled 2015-09-24 (×2): qty 11

## 2015-09-24 NOTE — ED Notes (Signed)
Seen in ED Wednesday and diagnosed with pneumonia.  C/O increasingly feeling short of breath.  Just finished Z-pack and taking amoxicillin.

## 2015-09-24 NOTE — H&P (Signed)
[facility] -  at [facility]   PATIENT NAME: Bianca Hill    MR#:  [id number]  DATE OF BIRTH:  [birth year]  DATE OF ADMISSION:  09/24/2015  PRIMARY CARE PHYSICIAN: No PCP Per Patient   REQUESTING/REFERRING PHYSICIAN: McShane  CHIEF COMPLAINT:   Shortness of breath with wheezing HISTORY OF PRESENT ILLNESS:  Bianca Hill  is a 39 y.o. female with a known history of diver's mellitus, hypertension, hyperlipidemia with 40-pack-year history of smoking, still continues to smoke is presenting to the ED with a chief complaint of worsening of shortness of breath and productive cough. patient came into the ED 3 times in this week so far because of her shortness of breath. On last Wednesday she was started on antibiotics amoxicillin and azithromycin and was sent home. Patient is not feeling better and started wheezing with productive cough. Repeat chest x-ray has revealed diffuse peribronchial a space opacities  PAST MEDICAL HISTORY:   Past Medical History  Diagnosis Date  . Diabetes mellitus without complication (HCC)   . Hypertension   . Hypercholesteremia    COPD  PAST SURGICAL HISTOIRY:   Past Surgical History  Procedure Laterality Date  . Refractive surgery      SOCIAL HISTORY:   Social History  Substance Use Topics  . Smoking status: Current Every Day Smoker -- 1.00 packs/day    Types: Cigarettes  . Smokeless tobacco: Not on file  . Alcohol Use: No    FAMILY HISTORY:  No family history on file.  DRUG ALLERGIES:  No Known Allergies  REVIEW OF SYSTEMS:  CONSTITUTIONAL: No fever, reporting fatigue or weakness.  EYES: No blurred or double vision.  EARS, NOSE, AND THROAT: No tinnitus or ear pain.  RESPIRATORY:  has productive cough, shortness of breath, wheezing , denies  hemoptysis.  CARDIOVASCULAR: No chest pain, orthopnea, edema.  GASTROINTESTINAL: No nausea, vomiting, diarrhea or abdominal pain.  GENITOURINARY: No dysuria, hematuria.   ENDOCRINE: No polyuria, nocturia,  HEMATOLOGY: No anemia, easy bruising or bleeding SKIN: No rash or lesion. MUSCULOSKELETAL: No joint pain or arthritis.   NEUROLOGIC: No tingling, numbness, weakness.  PSYCHIATRY: No anxiety or depression.   MEDICATIONS AT HOME:   Prior to Admission medications   Medication Sig Start Date End Date Taking? Authorizing Provider  albuterol (PROVENTIL HFA;VENTOLIN HFA) 108 (90 BASE) MCG/ACT inhaler Inhale 2 puffs into the lungs every 6 (six) hours as needed for wheezing or shortness of breath. 09/22/15  Yes Darien Ramus, MD  amoxicillin (AMOXIL) 500 MG tablet Take 1 tablet (500 mg total) by mouth 2 (two) times daily. 09/22/15  Yes Darien Ramus, MD  azithromycin (ZITHROMAX) 500 MG tablet Take 1 tablet (500 mg total) by mouth daily. Take 1 tablet daily for 3 days. 09/22/15 09/25/15 Yes Darien Ramus, MD  hydrochlorothiazide (HYDRODIURIL) 25 MG tablet Take 1 tablet (25 mg total) by mouth daily. 09/20/15  Yes Rebecka Apley, MD  lisinopril (PRINIVIL,ZESTRIL) 20 MG tablet Take 1 tablet (20 mg total) by mouth daily. 09/20/15 09/19/16 Yes Rebecka Apley, MD  metFORMIN (GLUCOPHAGE) 500 MG tablet Take 1 tablet (500 mg total) by mouth 2 (two) times daily with a meal. 09/20/15 09/19/16 Yes Rebecka Apley, MD  metoprolol (LOPRESSOR) 50 MG tablet Take 1 tablet (50 mg total) by mouth 2 (two) times daily. 09/20/15 09/19/16 Yes Rebecka Apley, MD  simvastatin (ZOCOR) 20 MG tablet Take 1 tablet (20 mg total) by mouth daily. 09/20/15  Yes Rebecka Apley, MD  furosemide (LASIX) 20 MG tablet Take 1 tablet (20 mg total) by mouth daily. 09/20/15 09/19/16  Rebecka Apley, MD      VITAL SIGNS:  Blood pressure 162/88, pulse 78, temperature 97.9 F (36.6 C), temperature source Oral, resp. rate 23, height 6' (1.829 m), weight 158.759 kg (350 lb), last menstrual period 08/29/2015, SpO2 93 %.  PHYSICAL EXAMINATION:  GENERAL:  39 y.o.-year-old patient lying  in the bed with no acute distress. Morbidly obese EYES: Pupils equal, round, reactive to light and accommodation. No scleral icterus. Extraocular muscles intact.  HEENT: Head atraumatic, normocephalic. Oropharynx and nasopharynx clear.  NECK:  Supple, no jugular venous distention. No thyroid enlargement, no tenderness.  LUNGS: Diminished breath sounds bilaterally, diffuse wheezing, no rales,rhonchi , positive crepitation. No use of accessory muscles of respiration.  CARDIOVASCULAR: S1, S2 normal. No murmurs, rubs, or gallops.  ABDOMEN: Soft, nontender, nondistended. Bowel sounds present. No organomegaly or mass.  EXTREMITIES: No pedal edema, cyanosis, or clubbing.  NEUROLOGIC: Cranial nerves II through XII are intact. Muscle strength 5/5 in all extremities. Sensation intact. Gait not checked.  PSYCHIATRIC: The patient is alert and oriented x 3.  SKIN: No obvious rash, lesion, or ulcer.   LABORATORY PANEL:   CBC  Recent Labs Lab 09/24/15 1825  WBC 7.4  HGB 14.5  HCT 43.2  PLT 171   ------------------------------------------------------------------------------------------------------------------  Chemistries   Recent Labs Lab 09/24/15 1825  NA 135  K 4.0  CL 104  CO2 26  GLUCOSE 240*  BUN 31*  CREATININE 1.79*  CALCIUM 8.1*  AST 33  ALT 30  ALKPHOS 72  BILITOT 0.5   ------------------------------------------------------------------------------------------------------------------  Cardiac Enzymes  Recent Labs Lab 09/24/15 1825  TROPONINI <0.03   ------------------------------------------------------------------------------------------------------------------  RADIOLOGY:  Dg Chest 2 View  09/24/2015  CLINICAL DATA:  Worsening cough, shortness breath and malaise. Diagnosed with pneumonia earlier this week. EXAM: CHEST  2 VIEW COMPARISON:  09/22/2015 and 09/19/2015. FINDINGS: The heart size and mediastinal contours are stable. There are diffuse peribronchial  airspace opacities with interval partial clearing of the anterior right lower lobe focal airspace disease seen on the most recent study. No apparent worsening or pleural effusion identified. The bones appear unchanged. IMPRESSION: Interval partial clearing of right lower lobe infiltrate. Diffuse peribronchial airspace opacities are otherwise unchanged. Electronically Signed   By: Carey Bullocks M.D.   On: 09/24/2015 19:11    EKG:   Orders placed or performed during the hospital encounter of 09/24/15  . ED EKG  . ED EKG    IMPRESSION AND PLAN:   1. Acute respiratory failure secondary to healthcare associated pneumonia and acute COPD exacerbation Patient failed outpatient antibiotics We will start her on broad-spectrum IV antibiotics cefepime, vancomycin and levofloxacin and narrow down antibiotics once patient is clinically stable depending on the sputum culture and sensitivity results We will get sputum culture and sensitivity  2. Acute exacerbation of COPD-patient has 40-pack-year history of smoking  Start her on IV Solu-Medrol, nebulizer treatments and oxygen Continue antibiotics   3. Acute kidney injury Provided gentle hydration with IV fluids and monitor renal function closely. Avoid nephrotoxins and check a.m. labs including BMP I don't think patient is fluid overloaded at this time, patient does not have any history of congestive heart failure. Takes Lasix for lower extremity edema  4. History of diabetes mellitus -non-insulin-dependent   hold metformin at this time Sliding scale insulin  5. Malignant hypertension Provided Lopressor 5 mg IV as needed basis and resume her home  medications lisinopril, metoprolol, hydrochlorothiazide and titrate as needed basis    Provide GI and DVT prophylaxis  All the records are reviewed and case discussed with ED provider. Management plans discussed with the patient, family and they are in agreement.  CODE STATUS:fc, cousin brother  Maylon CosDanny Martin is the healthcare power of attorney  TOTAL TIME TAKING CARE OF THIS PATIENT:45  minutes.    Ramonita LabGouru, Romario Tith M.D on 09/24/2015 at 9:45 PM  Between 7am to 6pm - Pager - [phone]  After 6pm go to [email or web address] - password [email or web address] [facility]  [facility]  Office  [phone]  CC: Primary care physician; No PCP Per Patient

## 2015-09-24 NOTE — ED Provider Notes (Addendum)
[facility] Emergency Department Provider Note  ____________________________________________   I have reviewed the triage vital signs and the nursing notes.   HISTORY  Chief Complaint Shortness of Breath    HPI Bianca Hill is a 39 y.o. female with a history of morbid obesity, diabetes mellitus, retention hypercholesterolemia, as well as a history of ammonia in the past presented today with shortness of breath and cough. She has been having shortness of breath and coughfor the last 4 days. Also wheezing and runny nose. She denies fever. She was started on antibiotics but still feels too short of breath to really get around. Denies unilateral leg swelling. Denies personal family history of PE denies recent surgery denies recent travel.  Patient did have bilateral leg swelling or this week and was given a few doses of Lasix. Subsequent to that that she was brought in here and was diagnosed with pneumonia on chest x-ray.  Past Medical History  Diagnosis Date  . Diabetes mellitus without complication (HCC)   . Hypertension   . Hypercholesteremia     Patient Active Problem List   Diagnosis Date Noted  . IDDM (insulin dependent diabetes mellitus) (HCC) 02/09/2013    Past Surgical History  Procedure Laterality Date  . Refractive surgery      Current Outpatient Rx  Name  Route  Sig  Dispense  Refill  . albuterol (PROVENTIL HFA;VENTOLIN HFA) 108 (90 BASE) MCG/ACT inhaler   Inhalation   Inhale 2 puffs into the lungs every 6 (six) hours as needed for wheezing or shortness of breath.   1 Inhaler   2   . amoxicillin (AMOXIL) 500 MG tablet   Oral   Take 1 tablet (500 mg total) by mouth 2 (two) times daily.   14 tablet   0   . azithromycin (ZITHROMAX) 500 MG tablet   Oral   Take 1 tablet (500 mg total) by mouth daily. Take 1 tablet daily for 3 days.   20 tablet   0   . furosemide (LASIX) 20 MG tablet   Oral   Take 1 tablet (20 mg total) by mouth  daily.   3 tablet   0   . hydrochlorothiazide (HYDRODIURIL) 25 MG tablet   Oral   Take 1 tablet (25 mg total) by mouth daily.   30 tablet   0   . lisinopril (PRINIVIL,ZESTRIL) 20 MG tablet   Oral   Take 1 tablet (20 mg total) by mouth daily.   30 tablet   0   . lisinopril-hydrochlorothiazide (PRINZIDE,ZESTORETIC) 20-25 MG tablet   Oral   Take 1 tablet by mouth daily.         . metFORMIN (GLUCOPHAGE) 500 MG tablet   Oral   Take 1 tablet (500 mg total) by mouth 2 (two) times daily with a meal.   60 tablet   0   . metoprolol (LOPRESSOR) 50 MG tablet   Oral   Take 1 tablet (50 mg total) by mouth 2 (two) times daily.   60 tablet   0   . simvastatin (ZOCOR) 20 MG tablet   Oral   Take 1 tablet (20 mg total) by mouth daily.   30 tablet   0     Allergies Review of patient's allergies indicates no known allergies.  No family history on file.  Social History Social History  Substance Use Topics  . Smoking status: Current Every Day Smoker -- 1.00 packs/day    Types: Cigarettes  .  Smokeless tobacco: None  . Alcohol Use: No    Review of Systems Constitutional: No fever/chills Eyes: No visual changes. ENT: No sore throat. No stiff neck no neck pain Cardiovascular: Denies chest pain. Respiratory: shortness of breath. Gastrointestinal:   no vomiting.  No diarrhea.  No constipation. Genitourinary: Negative for dysuria. Musculoskeletal: Negative lower extremity swelling Skin: Negative for rash. Neurological: Negative for headaches, focal weakness or numbness. 10-point ROS otherwise negative.  ____________________________________________   PHYSICAL EXAM:  VITAL SIGNS: ED Triage Vitals  Enc Vitals Group     BP 09/24/15 1750 169/94 mmHg     Pulse Rate 09/24/15 1750 75     Resp 09/24/15 1750 26     Temp 09/24/15 1750 97.9 F (36.6 C)     Temp Source 09/24/15 1750 Oral     SpO2 09/24/15 1750 91 %     Weight 09/24/15 1750 350 lb (158.759 kg)     Height  09/24/15 1750 6' (1.829 m)     Head Cir --      Peak Flow --      Pain Score 09/24/15 1751 0     Pain Loc --      Pain Edu? --      Excl. in GC? --     Constitutional: Alert and oriented. Well appearing and in no acute distress. Eyes: Conjunctivae are normal. PERRL. EOMI. Head: Atraumatic. Nose: No congestion/rhinnorhea. Mouth/Throat: Mucous membranes are moist.  Oropharynx non-erythematous. Neck: No stridor.   Nontender with no meningismus Cardiovascular: Normal rate, regular rhythm. Grossly normal heart sounds.  Good peripheral circulation. Respiratory: Normal respiratory effort.  No retractions. Diminished in the bases with occasional wheeze Abdominal: Soft and nontender. No distention. No guarding no rebound Back:  There is no focal tenderness or step off there is no midline tenderness there are no lesions noted. there is no CVA tenderness Musculoskeletal: No lower extremity tenderness. No joint effusions, no DVT signs strong distal pulses morbid obesity limits exam but does appear to be spitting symmetric bilateral edema Neurologic:  Normal speech and language. No gross focal neurologic deficits are appreciated.  Skin:  Skin is warm, dry and intact. No rash noted. Psychiatric: Mood and affect are normal. Speech and behavior are normal.  ____________________________________________   LABS (all labs ordered are listed, but only abnormal results are displayed)  Labs Reviewed  COMPREHENSIVE METABOLIC PANEL - Abnormal; Notable for the following:    Glucose, Bld 240 (*)    BUN 31 (*)    Creatinine, Ser 1.79 (*)    Calcium 8.1 (*)    Total Protein 5.4 (*)    Albumin 1.9 (*)    GFR calc non Af Amer 35 (*)    GFR calc Af Amer 40 (*)    All other components within normal limits  CULTURE, BLOOD (ROUTINE X 2)  CULTURE, BLOOD (ROUTINE X 2)  CBC  TROPONIN I  BRAIN NATRIURETIC PEPTIDE  POC URINE PREG, ED   ____________________________________________  EKG  I personally  interpreted any EKGs ordered by me or triage Sinus rhythm at 70 bpm no acute ST elevation or acute ST depression normal axis, unremarkable EKG aside from nonspecific ST flattening  ____________________________________  ____________________________________________  RADIOLOGY  I reviewed any imaging ordered by me or triage that were performed during my shift ____________________________________________   PROCEDURES  Procedure(s) performed: None  Critical Care performed: None  ____________________________________________   INITIAL IMPRESSION / ASSESSMENT AND PLAN / ED COURSE  Pertinent labs & imaging results  that were available during my care of the patient were reviewed by me and considered in my medical decision making (see chart for details).  Patient with elevated blood pressure at baseline, feels much better after nebulizer it was still desaturates with exertion. We'll obtain blood cultures, BNP is pending, patient has baseline renal insufficiency at least this had in the past and does today, which limits any further imaging. Morbid obesity also limits further imaging. If BNP is elevated we will give the patient Lasix she likely requires an echo. There is no evidence of acute ischemia. Low suspicion for PE at this time given multiple different other possible etiologies, including infectious and CHF but but will discuss further with hospitalist.  ----------------------------------------- 8:45 PM on 09/24/2015 -----------------------------------------  Dw/ radiologist He feels that her xray looks 'wet' which I agree with. He feels that CT scan would be of very limited utility given patient's body habitus and creatinine, as well he feels a VQ scan given the way her baseline chest x-ray looks would be very difficult to interpret. The decision then is whether or not to start the patient on anticoagulation at this time I do not feel that it is necessary, it is my impression the patient has  elevated BNP, leg swelling which got better with Lasix and persistent shortness of breath and orthopnea with evidence of CHF on chest x-ray. Personally would defer at this time anticoagulation for a PE that I think is probably not there given multiple other factors that certainly contributing to her symptoms..  ________   FINAL CLINICAL IMPRESSION(S) / ED DIAGNOSES  Final diagnoses:  Shortness of breath     Jeanmarie Plant, MD 09/24/15 2019  Jeanmarie Plant, MD 09/24/15 [id number]  Jeanmarie Plant, MD 09/24/15 [phone]

## 2015-09-24 NOTE — ED Notes (Signed)
While walking pt's O2 was resting around 96%, HR remained stable at 86.   When pt grew tired from walking her RR rose to 45 breaths and her O2 dropped to 83%, HR rose to 98.   Eventually her stats leveled back out: O2 93% , HR 88.   When pt. Finally returned to room while sitting on the side of the bed her stats dropped again: 85% O2 and HR 92.

## 2015-09-24 NOTE — ED Notes (Signed)
Incentive spirometer given and pt teaching with return demonstration done.

## 2015-09-25 ENCOUNTER — Inpatient Hospital Stay (HOSPITAL_COMMUNITY)
Admit: 2015-09-25 | Discharge: 2015-09-25 | Disposition: A | Discharge status: Home / Self Care | Payer: Self-pay | Attending: Internal Medicine | Admitting: Internal Medicine

## 2015-09-25 ENCOUNTER — Inpatient Hospital Stay: Payer: Self-pay

## 2015-09-25 DIAGNOSIS — I509 Heart failure, unspecified: Secondary | ICD-10-CM

## 2015-09-25 LAB — GLUCOSE, CAPILLARY
GLUCOSE-CAPILLARY: 327 mg/dL — AB (ref 65–99)
GLUCOSE-CAPILLARY: 330 mg/dL — AB (ref 65–99)
GLUCOSE-CAPILLARY: 316 mg/dL — AB (ref 65–99)
GLUCOSE-CAPILLARY: 267 mg/dL — AB (ref 65–99)

## 2015-09-25 LAB — MRSA PCR SCREENING: MRSA BY PCR: NEGATIVE

## 2015-09-25 LAB — BRAIN NATRIURETIC PEPTIDE: B Natriuretic Peptide: 187 pg/mL — ABNORMAL HIGH (ref 0.0–100.0)

## 2015-09-25 MED ORDER — VANCOMYCIN HCL 10 G IV SOLR
1250.0000 mg | Freq: Once | INTRAVENOUS | Status: AC
  Administered 2015-09-25: 1250 mg via INTRAVENOUS
  Filled 2015-09-25: qty 1250

## 2015-09-25 MED ORDER — PNEUMOCOCCAL VAC POLYVALENT 25 MCG/0.5ML IJ INJ
0.5000 mL | INJECTION | INTRAMUSCULAR | Status: DC
  Filled 2015-09-25 (×2): qty 0.5

## 2015-09-25 MED ORDER — NICOTINE POLACRILEX 2 MG MT GUM
2.0000 mg | CHEWING_GUM | OROMUCOSAL | Status: DC | PRN
  Administered 2015-09-26: 2 mg via ORAL
  Filled 2015-09-25 (×2): qty 1

## 2015-09-25 MED ORDER — SODIUM CHLORIDE 0.9 % IJ SOLN
3.0000 mL | INTRAMUSCULAR | Status: DC | PRN
  Administered 2015-09-25 – 2015-09-26 (×3): 3 mL via INTRAVENOUS
  Filled 2015-09-25 (×2): qty 10

## 2015-09-25 MED ORDER — NICOTINE 21 MG/24HR TD PT24: 21.0000 mg | MEDICATED_PATCH | Freq: Every day | TRANSDERMAL | Status: DC

## 2015-09-25 MED ORDER — VANCOMYCIN HCL 10 G IV SOLR
1250.0000 mg | Freq: Two times a day (BID) | INTRAVENOUS | Status: DC
  Administered 2015-09-25 – 2015-09-26 (×3): 1250 mg via INTRAVENOUS
  Filled 2015-09-25 (×4): qty 1250

## 2015-09-25 MED ORDER — SODIUM CHLORIDE 0.9 % IJ SOLN
3.0000 mL | Freq: Two times a day (BID) | INTRAMUSCULAR | Status: DC
  Administered 2015-09-25 – 2015-09-27 (×5): 3 mL via INTRAVENOUS

## 2015-09-25 MED ORDER — NICOTINE 21 MG/24HR TD PT24
21.0000 mg | MEDICATED_PATCH | Freq: Every day | TRANSDERMAL | Status: DC
  Administered 2015-09-25 – 2015-09-26 (×2): 21 mg via TRANSDERMAL
  Filled 2015-09-25 (×3): qty 1

## 2015-09-25 MED ORDER — HYDRALAZINE HCL 20 MG/ML IJ SOLN: 10.0000 mg | Freq: Four times a day (QID) | INTRAMUSCULAR | Status: DC | PRN

## 2015-09-25 MED ORDER — INFLUENZA VAC SPLIT QUAD 0.5 ML IM SUSY
0.5000 mL | PREFILLED_SYRINGE | INTRAMUSCULAR | Status: DC
  Filled 2015-09-25 (×3): qty 0.5

## 2015-09-25 MED ORDER — NICOTINE 21 MG/24HR TD PT24
21.0000 mg | MEDICATED_PATCH | Freq: Every day | TRANSDERMAL | Status: DC
  Administered 2015-09-25 (×2): 21 mg via TRANSDERMAL
  Filled 2015-09-25 (×2): qty 1

## 2015-09-25 MED ORDER — DEXTROSE 5 % IV SOLN
2.0000 g | Freq: Three times a day (TID) | INTRAVENOUS | Status: AC
  Administered 2015-09-25 – 2015-09-26 (×4): 2 g via INTRAVENOUS
  Filled 2015-09-25 (×5): qty 2

## 2015-09-25 NOTE — Progress Notes (Signed)
[facility] [place] at [facility]                                                                                                                                                                                            Patient Demographics   Bianca Hill, is a 39 y.o. female, DOB - Jun 03, 1976, XBJ:[id number]  Admit date - 09/24/2015   Admitting Physician Ramonita Lab, MD  Outpatient Primary MD for the patient is No PCP Per Patient   LOS - 1  Subjective: Patient's breathing is improved, continues show wheezing cough     Review of Systems:   CONSTITUTIONAL: No documented fever. No fatigue, weakness. No weight gain, no weight loss.  EYES: No blurry or double vision.  ENT: No tinnitus. No postnasal drip. No redness of the oropharynx.  RESPIRATORY: Positive cough and wheeze, no hemoptysis. Positive dyspnea.  CARDIOVASCULAR: No chest pain. No orthopnea. No palpitations. No syncope.  GASTROINTESTINAL: No nausea, no vomiting or diarrhea. No abdominal pain. No melena or hematochezia.  GENITOURINARY: No dysuria or hematuria.  ENDOCRINE: No polyuria or nocturia. No heat or cold intolerance.  HEMATOLOGY: No anemia. No bruising. No bleeding.  INTEGUMENTARY: No rashes. No lesions.  MUSCULOSKELETAL: No arthritis. No swelling. No gout.  NEUROLOGIC: No numbness, tingling, or ataxia. No seizure-type activity.  PSYCHIATRIC: No anxiety. No insomnia. No ADD.    Vitals:   Filed Vitals:   09/25/15 0534 09/25/15 0611 09/25/15 0735 09/25/15 1105  BP: 193/96 183/88  156/98  Pulse: 79   75  Temp:    98.4 F (36.9 C)  TempSrc:    Oral  Resp:    26  Height:      Weight:      SpO2: 94%  94% 97%    Wt Readings from Last 3 Encounters:  09/24/15 158.759 kg (350 lb)  09/22/15 158.759 kg (350 lb)  09/19/15 158.759 kg (350 lb)     Intake/Output Summary (Last 24 hours) at 09/25/15 1217 Last data filed at 09/25/15 1100  Gross per 24 hour  Intake    850 ml  Output     200 ml  Net    650 ml    Physical Exam:   GENERAL: Pleasant-appearing in no apparent distress.  HEAD, EYES, EARS, NOSE AND THROAT: Atraumatic, normocephalic. Extraocular muscles are intact. Pupils equal and reactive to light. Sclerae anicteric. No conjunctival injection. No oro-pharyngeal erythema.  NECK: Supple. There is no jugular venous distention. No bruits, no lymphadenopathy, no thyromegaly.  HEART: Regular rate and rhythm,. No murmurs, no rubs, no clicks.  LUNGS: Bilateral wheezing in both lungs  ABDOMEN: Soft, flat, nontender, nondistended. Has good bowel sounds. No hepatosplenomegaly appreciated.  EXTREMITIES: No evidence of any cyanosis, clubbing, or peripheral edema.  +2 pedal and radial pulses bilaterally.  NEUROLOGIC: The patient is alert, awake, and oriented x3 with no focal motor or sensory deficits appreciated bilaterally.  SKIN: Moist and warm with no rashes appreciated.  Psych: Not anxious, depressed LN: No inguinal LN enlargement    Antibiotics   Anti-infectives    Start     Dose/Rate Route Frequency Ordered Stop   09/25/15 0700  vancomycin (VANCOCIN) 1,250 mg in sodium chloride 0.9 % 250 mL IVPB     1,250 mg 166.7 mL/hr over 90 Minutes Intravenous Every 12 hours 09/25/15 0011     09/25/15 0100  ceFEPIme (MAXIPIME) 1 g in dextrose 5 % 50 mL IVPB     1 g 100 mL/hr over 30 Minutes Intravenous 3 times per day 09/24/15 2354 10/03/15 0059   09/25/15 0100  vancomycin (VANCOCIN) 1,250 mg in sodium chloride 0.9 % 250 mL IVPB     1,250 mg 166.7 mL/hr over 90 Minutes Intravenous  Once 09/25/15 0011 09/25/15 0255   09/24/15 2200  piperacillin-tazobactam (ZOSYN) IVPB 3.375 g  Status:  Discontinued     3.375 g 100 mL/hr over 30 Minutes Intravenous 3 times per day 09/24/15 2132 09/24/15 2143   09/24/15 2015  levofloxacin (LEVAQUIN) IVPB 750 mg     750 mg 100 mL/hr over 90 Minutes Intravenous  Once 09/24/15 2008 09/24/15 2321      Medications   Scheduled Meds: .  ceFEPime (MAXIPIME) IV  1 g Intravenous 3 times per day  . enoxaparin (LOVENOX) injection  40 mg Subcutaneous BID  . furosemide  20 mg Oral Daily  . hydrochlorothiazide  25 mg Oral Daily  . [START ON 09/26/2015] Influenza vac split quadrivalent PF  0.5 mL Intramuscular Tomorrow-1000  . insulin aspart  0-15 Units Subcutaneous TID WC  . insulin aspart  0-5 Units Subcutaneous QHS  . ipratropium-albuterol  3 mL Nebulization Q6H  . lisinopril  20 mg Oral Daily  . metoprolol  50 mg Oral BID  . nicotine  21 mg Transdermal Daily  . [START ON 09/26/2015] pneumococcal 23 valent vaccine  0.5 mL Intramuscular Tomorrow-1000  . simvastatin  20 mg Oral QHS  . sodium chloride  3 mL Intravenous Q12H  . vancomycin  1,250 mg Intravenous Q12H   Continuous Infusions:  PRN Meds:.albuterol, hydrALAZINE, metoprolol, sodium chloride   Data Review:   Micro Results Recent Results (from the past 240 hour(s))  Culture, blood (routine x 2)     Status: None (Preliminary result)   Collection Time: 09/22/15 11:23 AM  Result Value Ref Range Status   Specimen Description BLOOD LEFT ASSIST CONTROL  Final   Special Requests BOTTLES DRAWN AEROBIC AND ANAEROBIC 1CC  Final   Culture NO GROWTH 3 DAYS  Final   Report Status PENDING  Incomplete  Culture, blood (routine x 2)     Status: None (Preliminary result)   Collection Time: 09/22/15 11:23 AM  Result Value Ref Range Status   Specimen Description BLOOD LEFT IV START  Final   Special Requests BOTTLES DRAWN AEROBIC AND ANAEROBIC 2CC  Final   Culture NO GROWTH 3 DAYS  Final   Report Status PENDING  Incomplete  Culture, blood (routine x 2)     Status: None (Preliminary result)   Collection Time: 09/24/15  8:22 PM  Result Value Ref Range Status   Specimen Description BLOOD  RIGHT ASSIST CONTROL  Final   Special Requests BOTTLES DRAWN AEROBIC AND ANAEROBIC 4CC  Final   Culture NO GROWTH < 12 HOURS  Final   Report Status PENDING  Incomplete  Culture, blood (routine x  2)     Status: None (Preliminary result)   Collection Time: 09/24/15  8:40 PM  Result Value Ref Range Status   Specimen Description BLOOD RIGHT ASSIST CONTROL  Final   Special Requests BOTTLES DRAWN AEROBIC AND ANAEROBIC 4CC  Final   Culture NO GROWTH < 12 HOURS  Final   Report Status PENDING  Incomplete  MRSA PCR Screening     Status: None   Collection Time: 09/25/15 12:53 AM  Result Value Ref Range Status   MRSA by PCR NEGATIVE NEGATIVE Final    Comment:        The GeneXpert MRSA Assay (FDA approved for NASAL specimens only), is one component of a comprehensive MRSA colonization surveillance program. It is not intended to diagnose MRSA infection nor to guide or monitor treatment for MRSA infections.     Radiology Reports Dg Chest 2 View  09/24/2015  CLINICAL DATA:  Worsening cough, shortness breath and malaise. Diagnosed with pneumonia earlier this week. EXAM: CHEST  2 VIEW COMPARISON:  09/22/2015 and 09/19/2015. FINDINGS: The heart size and mediastinal contours are stable. There are diffuse peribronchial airspace opacities with interval partial clearing of the anterior right lower lobe focal airspace disease seen on the most recent study. No apparent worsening or pleural effusion identified. The bones appear unchanged. IMPRESSION: Interval partial clearing of right lower lobe infiltrate. Diffuse peribronchial airspace opacities are otherwise unchanged. Electronically Signed   By: Carey Bullocks M.D.   On: 09/24/2015 19:11   Dg Chest 2 View  09/22/2015  CLINICAL DATA:  Weakness, shortness of breath and dizziness. EXAM: CHEST  2 VIEW COMPARISON:  09/19/2015 FINDINGS: Normal heart size. Moderate interstitial edema is again identified which appears increased from previous exam. New anterior right lower lobe pneumonia. The visualized osseous structures are unremarkable. IMPRESSION: 1. Increase in interstitial edema. 2. New right lower lobe pneumonia. Electronically Signed   By: Signa Kell M.D.   On: 09/22/2015 10:19   Dg Chest 2 View  09/19/2015  CLINICAL DATA:  Lower extremity edema. Shortness of breath with exertion EXAM: CHEST  2 VIEW COMPARISON:  December 16, 2014 FINDINGS: There is trace edema in the bases. Lungs elsewhere clear. Heart size and pulmonary vascularity are within normal limits. No adenopathy. No bone lesions. IMPRESSION: Slight bibasilar edema. Lungs elsewhere clear. Cardiac silhouette within normal limits. Electronically Signed   By: Bretta Bang III M.D.   On: 09/19/2015 23:07   Ct Chest Wo Contrast  09/25/2015  CLINICAL DATA:  39 year old female with increasing shortness of breath despite antibiotics for the past several days for pneumonia EXAM: CT CHEST WITHOUT CONTRAST TECHNIQUE: Multidetector CT imaging of the chest was performed following the standard protocol without IV contrast. COMPARISON:  Chest x-ray 09/24/2015 FINDINGS: Mediastinum: 2.9 cm mass with internal dystrophic calcifications in the right thyroid gland. Nonspecific borderline mediastinal adenopathy. Right paratracheal lymph node measures 12 mm in short axis on image 19 of series 2. No anterior mediastinal mass. The thoracic esophagus is unremarkable. Heart/Vascular: Limited evaluation in the absence of intravenous contrast. The heart is within normal limits for size. No pericardial effusion. Normal caliber pulmonary artery and aorta. No significant atherosclerotic plaque. Lungs/Pleura: Small to moderate bilateral layering pleural effusions. Bilateral patchy foci of ground-glass attenuation airspace opacity  in a peribronchovascular distribution. Disease is most significant in the right upper lobe there is also involvement of all lobes of both lungs. Bones/Soft Tissues: No acute fracture or aggressive appearing lytic or blastic osseous lesion. Upper Abdomen: Cholelithiasis. Otherwise, unremarkable imaged upper abdomen. IMPRESSION: 1. Scattered bilateral patchy ground-glass attenuation airspace  opacities in a peribronchovascular distribution with associated bilateral small to moderate layering pleural effusion. Differential considerations include atypical or viral pneumonia versus noncardiogenic pulmonary edema. 2. Incidentally noted 2.9 cm nodule with internal dystrophic calcifications in the right thyroid gland. Recommend further evaluation with dedicated thyroid ultrasound. 3. Nonspecific borderline mediastinal adenopathy is likely reactive. 4. Incidentally imaged cholelithiasis. Electronically Signed   By: Malachy Moan M.D.   On: 09/25/2015 10:43     CBC  Recent Labs Lab 09/19/15 2247 09/22/15 1123 09/24/15 1825  WBC 9.4 5.1 7.4  HGB 14.3 13.9 14.5  HCT 43.6 42.7 43.2  PLT 220 175 171  MCV 92.2 93.4 91.6  MCH 30.3 30.3 30.7  MCHC 32.8 32.4 33.5  RDW 13.3 14.0 13.5  LYMPHSABS 2.7 1.0  --   MONOABS 0.7 0.7  --   EOSABS 0.3 0.1  --   BASOSABS 0.1 0.1  --     Chemistries   Recent Labs Lab 09/19/15 2247 09/22/15 1123 09/24/15 1825  NA 137 132* 135  K 4.4 3.8 4.0  CL 108 103 104  CO2 25 24 26   GLUCOSE 291* 199* 240*  BUN 24* 24* 31*  CREATININE 1.74* 1.86* 1.79*  CALCIUM 8.1* 8.5* 8.1*  AST 24 30 33  ALT 21 26 30   ALKPHOS 72 70 72  BILITOT 0.3 0.4 0.5   ------------------------------------------------------------------------------------------------------------------ estimated creatinine clearance is 72.3 mL/min (by C-G formula based on Cr of 1.79). ------------------------------------------------------------------------------------------------------------------ No results for input(s): HGBA1C in the last 72 hours. ------------------------------------------------------------------------------------------------------------------ No results for input(s): CHOL, HDL, LDLCALC, TRIG, CHOLHDL, LDLDIRECT in the last 72 hours. ------------------------------------------------------------------------------------------------------------------ No results for  input(s): TSH, T4TOTAL, T3FREE, THYROIDAB in the last 72 hours.  Invalid input(s): FREET3 ------------------------------------------------------------------------------------------------------------------ No results for input(s): VITAMINB12, FOLATE, FERRITIN, TIBC, IRON, RETICCTPCT in the last 72 hours.  Coagulation profile No results for input(s): INR, PROTIME in the last 168 hours.  No results for input(s): DDIMER in the last 72 hours.  Cardiac Enzymes  Recent Labs Lab 09/24/15 1825  TROPONINI <0.03   ------------------------------------------------------------------------------------------------------------------ Invalid input(s): POCBNP    Assessment & Plan   1. Acute respiratory failure secondary to  pneumonia. And acute COPD exasperation. Has been seen outpatient in the emergency room. And has failed oral therapy. Currently being treated with broad-spectrum antibiotics which I will continue for 1 more day.  2. Acute on chronic exacerbation of COPD-patient has 40-pack-year history of smoking Into new IV Solu-Medrol, nebulizer treatments and oxygen Continue antibiotics   3. Acute kidney injury Provided gentle hydration with IV fluids and monitor renal function closely. Avoid nephrotoxins and check BMP in the a.m.  4. History of diabetes mellitus -non-insulin-dependent  hold metformin at this time Sliding scale insulin  5. Malignant hypertension Continue IV hydralazine, hold HCTZ due to acute renal failure increase her Lopressor 100 twice a day  6. Nicotine addiction smoking cessation provided four min spent strongly recommended she stop smoking nicotine replacement will be ordered     Code Status Orders        Start     Ordered   09/24/15 2355  Full code   Continuous     09/24/15 2354  Consults  none DVT Prophylaxis  Lovenox  Lab Results  Component Value Date   PLT 171 09/24/2015     Time Spent in minutes   45 minutes    Auburn BilberryPATEL,  Tayron Hunnell M.D on 09/25/2015 at 12:17 PM  Between 7am to 6pm - Pager - [phone]  After 6pm go to [email or web address] - password [email or web address] [facility]  [facility]   Office  [phone]

## 2015-09-25 NOTE — Progress Notes (Signed)
ANTIBIOTIC CONSULT NOTE - INITIAL  Pharmacy Consult for Vancomycin/Cefepime Indication: pneumonia  No Known Allergies  Patient Measurements: Height: 6' (182.9 cm) Weight: (!) 350 lb (158.759 kg) IBW/kg (Calculated) : 73.1 Adjusted Body Weight: 107 kg  Vital Signs: Temp: 97.7 F (36.5 C) (12/24 2355) Temp Source: Oral (12/24 2355) BP: 170/95 mmHg (12/24 2355) Pulse Rate: 77 (12/24 2355) Intake/Output from previous day:   Intake/Output from this shift:    Labs:  Recent Labs  09/22/15 1123 09/24/15 1825  WBC 5.1 7.4  HGB 13.9 14.5  PLT 175 171  CREATININE 1.86* 1.79*   Estimated Creatinine Clearance: 72.3 mL/min (by C-G formula based on Cr of 1.79). No results for input(s): VANCOTROUGH, VANCOPEAK, VANCORANDOM, GENTTROUGH, GENTPEAK, GENTRANDOM, TOBRATROUGH, TOBRAPEAK, TOBRARND, AMIKACINPEAK, AMIKACINTROU, AMIKACIN in the last 72 hours.   Microbiology: Recent Results (from the past 720 hour(s))  Culture, blood (routine x 2)     Status: None (Preliminary result)   Collection Time: 09/22/15 11:23 AM  Result Value Ref Range Status   Specimen Description BLOOD LEFT ASSIST CONTROL  Final   Special Requests BOTTLES DRAWN AEROBIC AND ANAEROBIC 1CC  Final   Culture NO GROWTH 2 DAYS  Final   Report Status PENDING  Incomplete  Culture, blood (routine x 2)     Status: None (Preliminary result)   Collection Time: 09/22/15 11:23 AM  Result Value Ref Range Status   Specimen Description BLOOD LEFT IV START  Final   Special Requests BOTTLES DRAWN AEROBIC AND ANAEROBIC 2CC  Final   Culture NO GROWTH 2 DAYS  Final   Report Status PENDING  Incomplete    Medical History: Past Medical History  Diagnosis Date  . Diabetes mellitus without complication (HCC)   . Hypertension   . Hypercholesteremia     Medications:  Infusions:   Assessment: 39 yof   Vd 75 L, Ke 0.064 hr-1, T1/2 10.8 hr  Goal of Therapy:  Vancomycin trough level 15-20 mcg/ml  Plan:  Expected duration 7  days with resolution of temperature and/or normalization of WBC. Cefepime 1 gm IV Q8H and vancomycin 1.25 gm IV Q12H with stacked dosing second dose approximately 6 hours after first, predicted trough 15 mcg/mL. Pharmacy will continue to follow and adjust as needed to maintain trough 15 to 20 mcg/mL.  Carola FrostNathan A Zelphia Glover, Pharm.D., BCPS Clinical Pharmacist 09/25/2015,12:11 AM

## 2015-09-25 NOTE — Progress Notes (Signed)
ANTIBIOTIC CONSULT NOTE - Follow up  Pharmacy Consult for Cefepime Indication: pneumonia  No Known Allergies  Patient Measurements: Height: 6' (182.9 cm) Weight: (!) 350 lb (158.759 kg) IBW/kg (Calculated) : 73.1 Adjusted Body Weight: 107 kg  Vital Signs: Temp: 98.4 F (36.9 C) (12/25 1105) Temp Source: Oral (12/25 1105) BP: 156/98 mmHg (12/25 1105) Pulse Rate: 75 (12/25 1105) Intake/Output from previous day: 12/24 0701 - 12/25 0700 In: 50 [IV Piggyback:50] Out: 200 [Urine:200] Intake/Output from this shift: Total I/O In: 800 [P.O.:800] Out: 550 [Urine:550]  Labs:  Recent Labs  09/24/15 1825  WBC 7.4  HGB 14.5  PLT 171  CREATININE 1.79*   Estimated Creatinine Clearance: 72.3 mL/min (by C-G formula based on Cr of 1.79). No results for input(s): VANCOTROUGH, VANCOPEAK, VANCORANDOM, GENTTROUGH, GENTPEAK, GENTRANDOM, TOBRATROUGH, TOBRAPEAK, TOBRARND, AMIKACINPEAK, AMIKACINTROU, AMIKACIN in the last 72 hours.   Microbiology: Recent Results (from the past 720 hour(s))  Culture, blood (routine x 2)     Status: None (Preliminary result)   Collection Time: 09/22/15 11:23 AM  Result Value Ref Range Status   Specimen Description BLOOD LEFT ASSIST CONTROL  Final   Special Requests BOTTLES DRAWN AEROBIC AND ANAEROBIC 1CC  Final   Culture NO GROWTH 3 DAYS  Final   Report Status PENDING  Incomplete  Culture, blood (routine x 2)     Status: None (Preliminary result)   Collection Time: 09/22/15 11:23 AM  Result Value Ref Range Status   Specimen Description BLOOD LEFT IV START  Final   Special Requests BOTTLES DRAWN AEROBIC AND ANAEROBIC 2CC  Final   Culture NO GROWTH 3 DAYS  Final   Report Status PENDING  Incomplete  Culture, blood (routine x 2)     Status: None (Preliminary result)   Collection Time: 09/24/15  8:22 PM  Result Value Ref Range Status   Specimen Description BLOOD RIGHT ASSIST CONTROL  Final   Special Requests BOTTLES DRAWN AEROBIC AND ANAEROBIC 4CC  Final   Culture NO GROWTH < 12 HOURS  Final   Report Status PENDING  Incomplete  Culture, blood (routine x 2)     Status: None (Preliminary result)   Collection Time: 09/24/15  8:40 PM  Result Value Ref Range Status   Specimen Description BLOOD RIGHT ASSIST CONTROL  Final   Special Requests BOTTLES DRAWN AEROBIC AND ANAEROBIC 4CC  Final   Culture NO GROWTH < 12 HOURS  Final   Report Status PENDING  Incomplete  MRSA PCR Screening     Status: None   Collection Time: 09/25/15 12:53 AM  Result Value Ref Range Status   MRSA by PCR NEGATIVE NEGATIVE Final    Comment:        The GeneXpert MRSA Assay (FDA approved for NASAL specimens only), is one component of a comprehensive MRSA colonization surveillance program. It is not intended to diagnose MRSA infection nor to guide or monitor treatment for MRSA infections.    Assessment: [age] yof here with PNA on vancomycin and cefepime.  Plan:  Current orders for Cefepime 1 gm IV Q8H. Based on pt weight and renal function, will change dose to cefepime 2 g IV q8h.   Marty HeckWang, Jennalynn Rivard L, Pharm.D., BCPS Clinical Pharmacist 09/25/2015,1:15 PM

## 2015-09-26 DIAGNOSIS — J189 Pneumonia, unspecified organism: Secondary | ICD-10-CM

## 2015-09-26 DIAGNOSIS — Z720 Tobacco use: Secondary | ICD-10-CM

## 2015-09-26 LAB — BASIC METABOLIC PANEL
ANION GAP: 8 (ref 5–15)
BUN: 41 mg/dL — ABNORMAL HIGH (ref 6–20)
CALCIUM: 8 mg/dL — AB (ref 8.9–10.3)
CO2: 23 mmol/L (ref 22–32)
Chloride: 104 mmol/L (ref 101–111)
Creatinine, Ser: 2.07 mg/dL — ABNORMAL HIGH (ref 0.44–1.00)
GFR, EST AFRICAN AMERICAN: 34 mL/min — AB (ref >60)
GFR, EST NON AFRICAN AMERICAN: 29 mL/min — AB (ref >60)
GLUCOSE: 327 mg/dL — AB (ref 65–99)
POTASSIUM: 3.8 mmol/L (ref 3.5–5.1)
Sodium: 135 mmol/L (ref 135–145)

## 2015-09-26 LAB — CBC
HEMATOCRIT: 38.2 % (ref 35.0–47.0)
HEMOGLOBIN: 12.8 g/dL (ref 12.0–16.0)
MCH: 31.3 pg (ref 26.0–34.0)
MCHC: 33.6 g/dL (ref 32.0–36.0)
MCV: 93 fL (ref 80.0–100.0)
Platelets: 177 10*3/uL (ref 150–440)
RBC: 4.11 MIL/uL (ref 3.80–5.20)
RDW: 13.7 % (ref 11.5–14.5)
WBC: 8.4 10*3/uL (ref 3.6–11.0)

## 2015-09-26 LAB — STREP PNEUMONIAE URINARY ANTIGEN: Strep Pneumo Urinary Antigen: NEGATIVE

## 2015-09-26 LAB — GLUCOSE, CAPILLARY
GLUCOSE-CAPILLARY: 292 mg/dL — AB (ref 65–99)
GLUCOSE-CAPILLARY: 389 mg/dL — AB (ref 65–99)
Glucose-Capillary: 295 mg/dL — ABNORMAL HIGH (ref 65–99)
Glucose-Capillary: 363 mg/dL — ABNORMAL HIGH (ref 65–99)

## 2015-09-26 MED ORDER — METOPROLOL TARTRATE 100 MG PO TABS
100.0000 mg | ORAL_TABLET | Freq: Two times a day (BID) | ORAL | Status: DC
  Administered 2015-09-26 – 2015-09-27 (×2): 100 mg via ORAL
  Filled 2015-09-26 (×2): qty 1

## 2015-09-26 MED ORDER — INFLUENZA VAC SPLIT QUAD 0.5 ML IM SUSY
0.5000 mL | PREFILLED_SYRINGE | INTRAMUSCULAR | Status: AC
  Administered 2015-09-27: 0.5 mL via INTRAMUSCULAR
  Filled 2015-09-26: qty 0.5

## 2015-09-26 MED ORDER — PNEUMOCOCCAL VAC POLYVALENT 25 MCG/0.5ML IJ INJ
0.5000 mL | INJECTION | INTRAMUSCULAR | Status: AC
  Administered 2015-09-27: 0.5 mL via INTRAMUSCULAR
  Filled 2015-09-26: qty 0.5

## 2015-09-26 MED ORDER — AZITHROMYCIN 250 MG PO TABS
500.0000 mg | ORAL_TABLET | Freq: Every day | ORAL | Status: DC
  Administered 2015-09-26: 500 mg via ORAL
  Filled 2015-09-26: qty 2

## 2015-09-26 MED ORDER — LEVOFLOXACIN 750 MG PO TABS
750.0000 mg | ORAL_TABLET | Freq: Every day | ORAL | Status: DC
  Administered 2015-09-27: 750 mg via ORAL
  Filled 2015-09-26: qty 1

## 2015-09-26 MED ORDER — ALUM & MAG HYDROXIDE-SIMETH 200-200-20 MG/5ML PO SUSP
30.0000 mL | Freq: Four times a day (QID) | ORAL | Status: DC | PRN
Start: 2015-09-26 — End: 2015-09-27
  Administered 2015-09-26: 30 mL via ORAL
  Filled 2015-09-26: qty 30

## 2015-09-26 NOTE — Consult Note (Addendum)
PULMONARY CONSULT NOTE  Requesting MD/Service: [facility] Date of consult: 12/26 Reason for consultation:  PNA   HPI:  39 yo F with obesity, DM 2, Htn, hyperlipidemia whose acute illness dates to 12/19 when she presented to ED with 2 days of increased fluid retention and was treated with diuretic medications. She again presented to ED with cough and malaise that felt like her prior PNA. She was treated with azithromycin. She Again presented 12/24 with worsening symptoms that she describes as "tanking". Specifically, worsening dyspnea, NP cough. She denies fever, CP. She continues to have severe LE edema. She has been treated with cefepime and feels better than on admission.   Past Medical History  Diagnosis Date  . Diabetes mellitus without complication (HCC)   . Hypertension   . Hypercholesteremia     Past Surgical History  Procedure Laterality Date  . Refractive surgery      MEDICATIONS: I have reviewed all medications and confirmed regimen as documented  Social History   Social History  . Marital Status: Single    Spouse Name: N/A  . Number of Children: N/A  . Years of Education: N/A   Occupational History  . Not on file.   Social History Main Topics  . Smoking status: Current Every Day Smoker -- 1.00 packs/day    Types: Cigarettes  . Smokeless tobacco: Not on file  . Alcohol Use: No  . Drug Use: Not on file  . Sexual Activity: Not on file   Other Topics Concern  . Not on file   Social History Narrative    No family history on file.  ROS: No unexplained weight loss or weight gain No new focal weakness or sensory deficits No otalgia, hearing loss, visual changes, nasal and sinus symptoms, mouth and throat problems No neck pain or adenopathy No abdominal pain, N/V/D, diarrhea, change in bowel pattern No dysuria, change in urinary pattern No LE edema or calf tenderness   Filed Vitals:   09/26/15 0747 09/26/15 1122 09/26/15 1339 09/26/15 1947  BP:  173/92   190/72  Pulse:  89  80  Temp:  97.5 F (36.4 C)  98.5 F (36.9 C)  TempSrc:  Oral  Oral  Resp:  18  24  Height:      Weight:      SpO2: 96% 97% 92% 99%     EXAM:  Gen: Markedly obese, No overt respiratory distress HEENT: NCAT, oropharynx normal Neck: Supple without LAN, thyromegaly, JVD Lungs: breath sounds full, percussion note normal, RLL crackles Cardiovascular: Reg rhythm, rate normal, no murmurs noted Abdomen: Soft, nontender, normal BS Ext: severe symmetric edema Neuro: CNs grossly intact, motor and sensory intact, DTRs symmetric Skin: Limited exam, no lesions noted  DATA:   BMP Latest Ref Rng 09/26/2015 09/24/2015 09/22/2015  Glucose 65 - 99 mg/dL 960(A327(H) 540(J240(H) 811(B199(H)  BUN 6 - 20 mg/dL 14(N41(H) 82(N31(H) 56(O24(H)  Creatinine 0.44 - 1.00 mg/dL 1.30(Q2.07(H) 6.57(Q1.79(H) 4.69(G1.86(H)  Sodium 135 - 145 mmol/L 135 135 132(L)  Potassium 3.5 - 5.1 mmol/L 3.8 4.0 3.8  Chloride 101 - 111 mmol/L 104 104 103  CO2 22 - 32 mmol/L 23 26 24   Calcium 8.9 - 10.3 mg/dL 8.0(L) 8.1(L) 8.5(L)    CBC Latest Ref Rng 09/26/2015 09/24/2015 09/22/2015  WBC 3.6 - 11.0 K/uL 8.4 7.4 5.1  Hemoglobin 12.0 - 16.0 g/dL 29.512.8 28.414.5 13.213.9  Hematocrit 35.0 - 47.0 % 38.2 43.2 42.7  Platelets 150 - 440 K/uL 177 171 175    CXR (  12/24): vague bibasilar interstitial infiltrates  CT chest ( 12/25): B patchy ground glass opacities. Small B effusions   IMPRESSION: 1) Smoker 2) obesity 3) hypervolemia 4) Likely infectious PNA (CAP) - bacterial vs atypical vs viral 5) small bilateral effusions - either reactive/inflammatory or due to hypervolemia  PLAN/REC: Change abx to levofloxacin X 5 more days beginning 12/27 I will arrange outpt follow up in pulmonary clinic in 3-4 weeks with CXR I counseled re: smoking cessation and will discuss this further as outpt  PCCM will sign off. Please call if we can be of further assistance   Billy Fischer, MD PCCM service Mobile [phone] Pager [phone]

## 2015-09-26 NOTE — Plan of Care (Signed)
Problem: Respiratory: Goal: Respiratory status will improve Outcome: Progressing Less dyspnea noted this shift compared to admission

## 2015-09-26 NOTE — Care Management Note (Signed)
Case Management Note  Patient Details  Name: Bianca Hill MRN: [id number] Date of Birth: 1976/07/27  Subjective/Objective:                  Met with patient to discuss discharge. She is in between jobs right now and does not have health insurance. She states her best contact number is [phone]. She states she lives alone and able to drive. She states she is independent with daily activities. She of course does not have a PCP. She agrees to [employer] assistance.   Action/Plan: Referral made to [facility]. Application to both [facility] and [facility] delivered to patient for Rx assistance. I have faxed referral to [facility] also.   Expected Discharge Date:                  Expected Discharge Plan:     In-House Referral:     Discharge planning Services  CM Consult, Medication [facility], [facility]  Post Acute Care Choice:    Choice offered to:  Patient  DME Arranged:    DME Agency:     HH Arranged:    [place] Agency:     Status of Service:  In process, will continue to follow  Medicare Important Message Given:    Date Medicare IM Given:    Medicare IM give by:    Date Additional Medicare IM Given:    Additional Medicare Important Message give by:     If discussed at [place] of Stay Meetings, dates discussed:    Additional Comments:  Marshell Garfinkel, RN 09/26/2015, 11:00 AM

## 2015-09-26 NOTE — Progress Notes (Signed)
[facility] [place] at [facility]                                                                                                                                                                                            Patient Demographics   Bianca Hill, is a 39 y.o. female, DOB - 1976/09/16, XLK:[id number]  Admit date - 09/24/2015   Admitting Physician Ramonita Lab, MD  Outpatient Primary MD for the patient is No PCP Per Patient   LOS - 2  Subjective: Patient had a CT scan which showed some atypical pneumonia and/or fluid overload. Her breathing improved  Review of Systems:   CONSTITUTIONAL: No documented fever. No fatigue, weakness. No weight gain, no weight loss.  EYES: No blurry or double vision.  ENT: No tinnitus. No postnasal drip. No redness of the oropharynx.  RESPIRATORY: Positive cough and wheeze, no hemoptysis. Positive dyspnea.  CARDIOVASCULAR: No chest pain. No orthopnea. No palpitations. No syncope.  GASTROINTESTINAL: No nausea, no vomiting or diarrhea. No abdominal pain. No melena or hematochezia.  GENITOURINARY: No dysuria or hematuria.  ENDOCRINE: No polyuria or nocturia. No heat or cold intolerance.  HEMATOLOGY: No anemia. No bruising. No bleeding.  INTEGUMENTARY: No rashes. No lesions.  MUSCULOSKELETAL: No arthritis. No swelling. No gout.  NEUROLOGIC: No numbness, tingling, or ataxia. No seizure-type activity.  PSYCHIATRIC: No anxiety. No insomnia. No ADD.    Vitals:   Filed Vitals:   09/25/15 2004 09/26/15 0327 09/26/15 0747 09/26/15 1122  BP: 150/67 138/61  173/92  Pulse: 52 72  89  Temp: 97.8 F (36.6 C) 98 F (36.7 C)  97.5 F (36.4 C)  TempSrc: Oral Oral  Oral  Resp: Height:      Weight:      SpO2: 88% 90% 96% 97%    Wt Readings from Last 3 Encounters:  09/24/15 158.759 kg (350 lb)  09/22/15 158.759 kg (350 lb)  09/19/15 158.759 kg (350 lb)     Intake/Output Summary (Last 24 hours) at 09/26/15  1339 Last data filed at 09/26/15 1336  Gross per 24 hour  Intake   1660 ml  Output    600 ml  Net   1060 ml    Physical Exam:   GENERAL: Pleasant-appearing in no apparent distress.  HEAD, EYES, EARS, NOSE AND THROAT: Atraumatic, normocephalic. Extraocular muscles are intact. Pupils equal and reactive to light. Sclerae anicteric. No conjunctival injection. No oro-pharyngeal erythema.  NECK: Supple. There is no jugular venous distention. No bruits, no lymphadenopathy, no thyromegaly.  HEART: Regular rate and rhythm,. No murmurs, no rubs, no  clicks.  LUNGS: Bilateral wheezing in both lungs  ABDOMEN: Soft, flat, nontender, nondistended. Has good bowel sounds. No hepatosplenomegaly appreciated.  EXTREMITIES: No evidence of any cyanosis, clubbing, or peripheral edema.  +2 pedal and radial pulses bilaterally.  NEUROLOGIC: The patient is alert, awake, and oriented x3 with no focal motor or sensory deficits appreciated bilaterally.  SKIN: Moist and warm with no rashes appreciated.  Psych: Not anxious, depressed LN: No inguinal LN enlargement    Antibiotics   Anti-infectives    Start     Dose/Rate Route Frequency Ordered Stop   09/26/15 1115  azithromycin (ZITHROMAX) tablet 500 mg     500 mg Oral Daily 09/26/15 1103     09/25/15 1700  ceFEPIme (MAXIPIME) 2 g in dextrose 5 % 50 mL IVPB     2 g 100 mL/hr over 30 Minutes Intravenous 3 times per day 09/25/15 1314 10/03/15 0059   09/25/15 0700  vancomycin (VANCOCIN) 1,250 mg in sodium chloride 0.9 % 250 mL IVPB  Status:  Discontinued     1,250 mg 166.7 mL/hr over 90 Minutes Intravenous Every 12 hours 09/25/15 0011 09/26/15 1102   09/25/15 0100  ceFEPIme (MAXIPIME) 1 g in dextrose 5 % 50 mL IVPB  Status:  Discontinued     1 g 100 mL/hr over 30 Minutes Intravenous 3 times per day 09/24/15 2354 09/25/15 1314   09/25/15 0100  vancomycin (VANCOCIN) 1,250 mg in sodium chloride 0.9 % 250 mL IVPB     1,250 mg 166.7 mL/hr over 90 Minutes Intravenous   Once 09/25/15 0011 09/25/15 0255   09/24/15 2200  piperacillin-tazobactam (ZOSYN) IVPB 3.375 g  Status:  Discontinued     3.375 g 100 mL/hr over 30 Minutes Intravenous 3 times per day 09/24/15 2132 09/24/15 2143   09/24/15 2015  levofloxacin (LEVAQUIN) IVPB 750 mg     750 mg 100 mL/hr over 90 Minutes Intravenous  Once 09/24/15 2008 09/24/15 2321      Medications   Scheduled Meds: . azithromycin  500 mg Oral Daily  . ceFEPime (MAXIPIME) IV  2 g Intravenous 3 times per day  . enoxaparin (LOVENOX) injection  40 mg Subcutaneous BID  . Influenza vac split quadrivalent PF  0.5 mL Intramuscular Tomorrow-1000  . insulin aspart  0-15 Units Subcutaneous TID WC  . insulin aspart  0-5 Units Subcutaneous QHS  . ipratropium-albuterol  3 mL Nebulization Q6H  . metoprolol  50 mg Oral BID  . nicotine  21 mg Transdermal Daily  . pneumococcal 23 valent vaccine  0.5 mL Intramuscular Tomorrow-1000  . simvastatin  20 mg Oral QHS  . sodium chloride  3 mL Intravenous Q12H   Continuous Infusions:  PRN Meds:.albuterol, alum & mag hydroxide-simeth, hydrALAZINE, metoprolol, nicotine polacrilex, sodium chloride   Data Review:   Micro Results Recent Results (from the past 240 hour(s))  Culture, blood (routine x 2)     Status: None (Preliminary result)   Collection Time: 09/22/15 11:23 AM  Result Value Ref Range Status   Specimen Description BLOOD LEFT ASSIST CONTROL  Final   Special Requests BOTTLES DRAWN AEROBIC AND ANAEROBIC 1CC  Final   Culture NO GROWTH 3 DAYS  Final   Report Status PENDING  Incomplete  Culture, blood (routine x 2)     Status: None (Preliminary result)   Collection Time: 09/22/15 11:23 AM  Result Value Ref Range Status   Specimen Description BLOOD LEFT IV START  Final   Special Requests BOTTLES DRAWN AEROBIC AND ANAEROBIC 2CC  Final   Culture NO GROWTH 3 DAYS  Final   Report Status PENDING  Incomplete  Culture, blood (routine x 2)     Status: None (Preliminary result)    Collection Time: 09/24/15  8:22 PM  Result Value Ref Range Status   Specimen Description BLOOD RIGHT ASSIST CONTROL  Final   Special Requests BOTTLES DRAWN AEROBIC AND ANAEROBIC 4CC  Final   Culture NO GROWTH < 12 HOURS  Final   Report Status PENDING  Incomplete  Culture, blood (routine x 2)     Status: None (Preliminary result)   Collection Time: 09/24/15  8:40 PM  Result Value Ref Range Status   Specimen Description BLOOD RIGHT ASSIST CONTROL  Final   Special Requests BOTTLES DRAWN AEROBIC AND ANAEROBIC 4CC  Final   Culture NO GROWTH < 12 HOURS  Final   Report Status PENDING  Incomplete  MRSA PCR Screening     Status: None   Collection Time: 09/25/15 12:53 AM  Result Value Ref Range Status   MRSA by PCR NEGATIVE NEGATIVE Final    Comment:        The GeneXpert MRSA Assay (FDA approved for NASAL specimens only), is one component of a comprehensive MRSA colonization surveillance program. It is not intended to diagnose MRSA infection nor to guide or monitor treatment for MRSA infections.     Radiology Reports Dg Chest 2 View  09/24/2015  CLINICAL DATA:  Worsening cough, shortness breath and malaise. Diagnosed with pneumonia earlier this week. EXAM: CHEST  2 VIEW COMPARISON:  09/22/2015 and 09/19/2015. FINDINGS: The heart size and mediastinal contours are stable. There are diffuse peribronchial airspace opacities with interval partial clearing of the anterior right lower lobe focal airspace disease seen on the most recent study. No apparent worsening or pleural effusion identified. The bones appear unchanged. IMPRESSION: Interval partial clearing of right lower lobe infiltrate. Diffuse peribronchial airspace opacities are otherwise unchanged. Electronically Signed   By: Carey Bullocks M.D.   On: 09/24/2015 19:11   Dg Chest 2 View  09/22/2015  CLINICAL DATA:  Weakness, shortness of breath and dizziness. EXAM: CHEST  2 VIEW COMPARISON:  09/19/2015 FINDINGS: Normal heart size.  Moderate interstitial edema is again identified which appears increased from previous exam. New anterior right lower lobe pneumonia. The visualized osseous structures are unremarkable. IMPRESSION: 1. Increase in interstitial edema. 2. New right lower lobe pneumonia. Electronically Signed   By: Signa Kell M.D.   On: 09/22/2015 10:19   Dg Chest 2 View  09/19/2015  CLINICAL DATA:  Lower extremity edema. Shortness of breath with exertion EXAM: CHEST  2 VIEW COMPARISON:  December 16, 2014 FINDINGS: There is trace edema in the bases. Lungs elsewhere clear. Heart size and pulmonary vascularity are within normal limits. No adenopathy. No bone lesions. IMPRESSION: Slight bibasilar edema. Lungs elsewhere clear. Cardiac silhouette within normal limits. Electronically Signed   By: Bretta Bang III M.D.   On: 09/19/2015 23:07   Ct Chest Wo Contrast  09/25/2015  CLINICAL DATA:  39 year old female with increasing shortness of breath despite antibiotics for the past several days for pneumonia EXAM: CT CHEST WITHOUT CONTRAST TECHNIQUE: Multidetector CT imaging of the chest was performed following the standard protocol without IV contrast. COMPARISON:  Chest x-ray 09/24/2015 FINDINGS: Mediastinum: 2.9 cm mass with internal dystrophic calcifications in the right thyroid gland. Nonspecific borderline mediastinal adenopathy. Right paratracheal lymph node measures 12 mm in short axis on image 19 of series 2. No anterior mediastinal mass. The  thoracic esophagus is unremarkable. Heart/Vascular: Limited evaluation in the absence of intravenous contrast. The heart is within normal limits for size. No pericardial effusion. Normal caliber pulmonary artery and aorta. No significant atherosclerotic plaque. Lungs/Pleura: Small to moderate bilateral layering pleural effusions. Bilateral patchy foci of ground-glass attenuation airspace opacity in a peribronchovascular distribution. Disease is most significant in the right upper lobe  there is also involvement of all lobes of both lungs. Bones/Soft Tissues: No acute fracture or aggressive appearing lytic or blastic osseous lesion. Upper Abdomen: Cholelithiasis. Otherwise, unremarkable imaged upper abdomen. IMPRESSION: 1. Scattered bilateral patchy ground-glass attenuation airspace opacities in a peribronchovascular distribution with associated bilateral small to moderate layering pleural effusion. Differential considerations include atypical or viral pneumonia versus noncardiogenic pulmonary edema. 2. Incidentally noted 2.9 cm nodule with internal dystrophic calcifications in the right thyroid gland. Recommend further evaluation with dedicated thyroid ultrasound. 3. Nonspecific borderline mediastinal adenopathy is likely reactive. 4. Incidentally imaged cholelithiasis. Electronically Signed   By: Malachy MoanHeath  McCullough M.D.   On: 09/25/2015 10:43     CBC  Recent Labs Lab 09/19/15 2247 09/22/15 1123 09/24/15 1825 09/26/15 0440  WBC 9.4 5.1 7.4 8.4  HGB 14.3 13.9 14.5 12.8  HCT 43.6 42.7 43.2 38.2  PLT 220 175 171 177  MCV 92.2 93.4 91.6 93.0  MCH 30.3 30.3 30.7 31.3  MCHC 32.8 32.4 33.5 33.6  RDW 13.3 14.0 13.5 13.7  LYMPHSABS 2.7 1.0  --   --   MONOABS 0.7 0.7  --   --   EOSABS 0.3 0.1  --   --   BASOSABS 0.1 0.1  --   --     Chemistries   Recent Labs Lab 09/19/15 2247 09/22/15 1123 09/24/15 1825 09/26/15 0440  NA 137 132* 135 135  K 4.4 3.8 4.0 3.8  CL 108 103 104 104  CO2 25 24 26 23   GLUCOSE 291* 199* 240* 327*  BUN 24* 24* 31* 41*  CREATININE 1.74* 1.86* 1.79* 2.07*  CALCIUM 8.1* 8.5* 8.1* 8.0*  AST 24 30 33  --   ALT 21 26 30   --   ALKPHOS 72 70 72  --   BILITOT 0.3 0.4 0.5  --    ------------------------------------------------------------------------------------------------------------------ estimated creatinine clearance is 62.5 mL/min (by C-G formula based on Cr of  2.07). ------------------------------------------------------------------------------------------------------------------ No results for input(s): HGBA1C in the last 72 hours. ------------------------------------------------------------------------------------------------------------------ No results for input(s): CHOL, HDL, LDLCALC, TRIG, CHOLHDL, LDLDIRECT in the last 72 hours. ------------------------------------------------------------------------------------------------------------------ No results for input(s): TSH, T4TOTAL, T3FREE, THYROIDAB in the last 72 hours.  Invalid input(s): FREET3 ------------------------------------------------------------------------------------------------------------------ No results for input(s): VITAMINB12, FOLATE, FERRITIN, TIBC, IRON, RETICCTPCT in the last 72 hours.  Coagulation profile No results for input(s): INR, PROTIME in the last 168 hours.  No results for input(s): DDIMER in the last 72 hours.  Cardiac Enzymes  Recent Labs Lab 09/24/15 1825  TROPONINI <0.03   ------------------------------------------------------------------------------------------------------------------ Invalid input(s): POCBNP    Assessment & Plan   1. Acute respiratory failure secondary to  pneumonia. And acute COPD exasperation.  CT suggestive atypical pneumonia at this time I'll change her to azithromycin, recently treated with Levaquin as outpatient continue cefepime for now. Discontinue vancomycin. Due to the abnormality on the CT LS pulmonary to evaluate.   2. Acute on chronic exacerbation of COPD-patient has 40-pack-year history of smoking Continue nebulizers    3. Acute kidney injury  Continue to monitor renal function closely her BNP was only slightly elevated so she is not fluid overloaded. Her echo  recently showed no evidence of systolic or diastolic dysfunction.  4. History of diabetes mellitus -non-insulin-dependent  hold metformin at this  time Sliding scale insulin  5. Malignant hypertension Continue IV hydralazine, continue Lopressor.  6. Nicotine addiction smoking cessation provided    Code Status Orders        Start     Ordered   09/24/15 2355  Full code   Continuous     09/24/15 2354           Consults  none DVT Prophylaxis  Lovenox  Lab Results  Component Value Date   PLT 177 09/26/2015     Time Spent in minutes   35 minutes    Auburn Bilberry M.D on 09/26/2015 at 1:39 PM  Between 7am to 6pm - Pager - [phone]  After 6pm go to [email or web address] - password [email or web address] [facility]  [facility]   Office  [phone]

## 2015-09-27 ENCOUNTER — Other Ambulatory Visit: Payer: Self-pay | Admitting: Pulmonary Disease

## 2015-09-27 DIAGNOSIS — J189 Pneumonia, unspecified organism: Secondary | ICD-10-CM

## 2015-09-27 LAB — BASIC METABOLIC PANEL
Anion gap: 5 (ref 5–15)
BUN: 45 mg/dL — AB (ref 6–20)
CHLORIDE: 104 mmol/L (ref 101–111)
CO2: 26 mmol/L (ref 22–32)
CREATININE: 2.03 mg/dL — AB (ref 0.44–1.00)
Calcium: 8.1 mg/dL — ABNORMAL LOW (ref 8.9–10.3)
GFR calc Af Amer: 35 mL/min — ABNORMAL LOW (ref >60)
GFR calc non Af Amer: 30 mL/min — ABNORMAL LOW (ref >60)
Glucose, Bld: 438 mg/dL — ABNORMAL HIGH (ref 65–99)
Potassium: 4.6 mmol/L (ref 3.5–5.1)
Sodium: 135 mmol/L (ref 135–145)

## 2015-09-27 LAB — GLUCOSE, CAPILLARY
GLUCOSE-CAPILLARY: 206 mg/dL — AB (ref 65–99)
Glucose-Capillary: 429 mg/dL — ABNORMAL HIGH (ref 65–99)

## 2015-09-27 MED ORDER — PREDNISONE 50 MG PO TABS
50.0000 mg | ORAL_TABLET | Freq: Every day | ORAL | Status: DC
Start: 2015-09-27 — End: 2015-12-27

## 2015-09-27 MED ORDER — CLONIDINE HCL 0.1 MG PO TABS
0.1000 mg | ORAL_TABLET | ORAL | Status: AC
  Administered 2015-09-27: 0.1 mg via ORAL
  Filled 2015-09-27: qty 1

## 2015-09-27 MED ORDER — HYDRALAZINE HCL 100 MG PO TABS: 100.0000 mg | ORAL_TABLET | Freq: Three times a day (TID) | ORAL | Status: DC

## 2015-09-27 MED ORDER — LEVOFLOXACIN 250 MG PO TABS: 750.0000 mg | ORAL_TABLET | Freq: Every day | ORAL | Status: DC

## 2015-09-27 MED ORDER — LISINOPRIL 20 MG PO TABS
20.0000 mg | ORAL_TABLET | Freq: Every day | ORAL | Status: DC
  Administered 2015-09-27: 20 mg via ORAL
  Filled 2015-09-27: qty 1

## 2015-09-27 MED ORDER — HYDRALAZINE HCL 50 MG PO TABS: 100.0000 mg | ORAL_TABLET | Freq: Three times a day (TID) | ORAL | Status: DC

## 2015-09-27 MED ORDER — GLIPIZIDE 5 MG PO TABS: 2.5000 mg | ORAL_TABLET | Freq: Two times a day (BID) | ORAL | Status: DC

## 2015-09-27 MED ORDER — HYDROCHLOROTHIAZIDE 25 MG PO TABS
25.0000 mg | ORAL_TABLET | Freq: Every day | ORAL | Status: DC
  Administered 2015-09-27: 25 mg via ORAL
  Filled 2015-09-27: qty 1

## 2015-09-27 MED ORDER — INSULIN ASPART 100 UNIT/ML ~~LOC~~ SOLN
20.0000 [IU] | Freq: Once | SUBCUTANEOUS | Status: AC
  Administered 2015-09-27: 20 [IU] via SUBCUTANEOUS
  Filled 2015-09-27: qty 20

## 2015-09-27 NOTE — Progress Notes (Signed)
Patients BP still elevated:  Filed Vitals:   09/27/15 1147 09/27/15 1207  BP: 186/77 184/74  Pulse: 70 69  Temp: 97.6 F (36.4 C)   Resp: 18   MD paged and made aware: MD to restart her home dose of hydrochlorothiazide. Will recheck BP in an hour and if continues to trend down, can continue with discharge.

## 2015-09-27 NOTE — Progress Notes (Signed)
Patient left hospital against medical advice. MD notified, called into patients room, charge RN also spoke with patient. Patient maintains she will leave AMA. Reports she understands she will have to pay her hospital bills. Refused new BP medication that was ordered by physician and BP recheck. Reported, "I just want to go home. Once I am home, I won't be stressed." IV and Tele removed, patient assessment unchanged from this AM. AMA form signed and in chart.

## 2015-09-27 NOTE — Progress Notes (Signed)
AM [facility], per NT, 429. Dr. Elpidio AnisSudini paged and made aware, orders to give 20u novolog this morning.

## 2015-09-27 NOTE — Progress Notes (Signed)
A&O. Independent. ON Room air. IV solu-medrol and antibiotics given. Slept well during the night. IV lopressor given for high blood pressure.

## 2015-09-27 NOTE — Discharge Instructions (Addendum)
DIET:  Cardiac diet and Diabetic diet  DISCHARGE CONDITION:  Stable  ACTIVITY:  Activity as tolerated  OXYGEN:  Home Oxygen: No.   Oxygen Delivery: room air  DISCHARGE LOCATION:  home   If you experience worsening of your admission symptoms, develop shortness of breath, life threatening emergency, suicidal or homicidal thoughts you must seek medical attention immediately by calling 911 or calling your MD immediately  if symptoms less severe.  You Must read complete instructions/literature along with all the possible adverse reactions/side effects for all the Medicines you take and that have been prescribed to you. Take any new Medicines after you have completely understood and accpet all the possible adverse reactions/side effects.   Please note  You were cared for by a hospitalist during your hospital stay. If you have any questions about your discharge medications or the care you received while you were in the hospital after you are discharged, you can call the unit and asked to speak with the hospitalist on call if the hospitalist that took care of you is not available. Once you are discharged, your primary care physician will handle any further medical issues. Please note that NO REFILLS for any discharge medications will be authorized once you are discharged, as it is imperative that you return to your primary care physician (or establish a relationship with a primary care physician if you do not have one) for your aftercare needs so that they can reassess your need for medications and monitor your lab values.   Blood Glucose Monitoring, Adult Monitoring your blood glucose (also know as blood sugar) helps you to manage your diabetes. It also helps you and your health care provider monitor your diabetes and determine how well your treatment plan is working. WHY SHOULD YOU MONITOR YOUR BLOOD GLUCOSE?  It can help you understand how food, exercise, and medicine affect your blood  glucose.  It allows you to know what your blood glucose is at any given moment. You can quickly tell if you are having low blood glucose (hypoglycemia) or high blood glucose (hyperglycemia).  It can help you and your health care provider know how to adjust your medicines.  It can help you understand how to manage an illness or adjust medicine for exercise. WHEN SHOULD YOU TEST? Your health care provider will help you decide how often you should check your blood glucose. This may depend on the type of diabetes you have, your diabetes control, or the types of medicines you are taking. Be sure to write down all of your blood glucose readings so that this information can be reviewed with your health care provider. See below for examples of testing times that your health care provider may suggest. Type 1 Diabetes  Test at least 2 times per day if your diabetes is well controlled, if you are using an insulin pump, or if you perform multiple daily injections.  If your diabetes is not well controlled or if you are sick, you may need to test more often.  It is a good idea to also test:  Before every insulin injection.  Before and after exercise.  Between meals and 2 hours after a meal.  Occasionally between 2:00 a.m. and 3:00 a.m. Type 2 Diabetes  If you are taking insulin, test at least 2 times per day. However, it is best to test before every insulin injection.  If you take medicines by mouth (orally), test 2 times a day.  If you are on a controlled diet,  test once a day.  If your diabetes is not well controlled or if you are sick, you may need to monitor more often. HOW TO MONITOR YOUR BLOOD GLUCOSE Supplies Needed  Blood glucose meter.  Test strips for your meter. Each meter has its own strips. You must use the strips that go with your own meter.  A pricking needle (lancet).  A device that holds the lancet (lancing device).  A journal or log book to write down your  results. Procedure  Wash your hands with soap and water. Alcohol is not preferred.  Prick the side of your finger (not the tip) with the lancet.  Gently milk the finger until a small drop of blood appears.  Follow the instructions that come with your meter for inserting the test strip, applying blood to the strip, and using your blood glucose meter. Other Areas to Get Blood for Testing Some meters allow you to use other areas of your body (other than your finger) to test your blood. These areas are called alternative sites. The most common alternative sites are:  The forearm.  The thigh.  The back area of the lower leg.  The palm of the hand. The blood flow in these areas is slower. Therefore, the blood glucose values you get may be delayed, and the numbers are different from what you would get from your fingers. Do not use alternative sites if you think you are having hypoglycemia. Your reading will not be accurate. Always use a finger if you are having hypoglycemia. Also, if you cannot feel your lows (hypoglycemia unawareness), always use your fingers for your blood glucose checks. ADDITIONAL TIPS FOR GLUCOSE MONITORING  Do not reuse lancets.  Always carry your supplies with you.  All blood glucose meters have a 24-hour "hotline" number to call if you have questions or need help.  Adjust (calibrate) your blood glucose meter with a control solution after finishing a few boxes of strips. BLOOD GLUCOSE RECORD KEEPING It is a good idea to keep a daily record or log of your blood glucose readings. Most glucose meters, if not all, keep your glucose records stored in the meter. Some meters come with the ability to download your records to your home computer. Keeping a record of your blood glucose readings is especially helpful if you are wanting to look for patterns. Make notes to go along with the blood glucose readings because you might forget what happened at that exact time. Keeping  good records helps you and your health care provider to work together to achieve good diabetes management.    This information is not intended to replace advice given to you by your health care provider. Make sure you discuss any questions you have with your health care provider.   Document Released: 09/20/2003 Document Revised: 10/08/2014 Document Reviewed: 02/09/2013 Elsevier Interactive Patient Education [employer]2016 [employer]

## 2015-09-27 NOTE — Progress Notes (Addendum)
Inpatient Diabetes Program Recommendations  AACE/ADA: New Consensus Statement on Inpatient Glycemic Control (2015)  Target Ranges:  Prepandial:   less than 140 mg/dL      Peak postprandial:   less than 180 mg/dL (1-2 hours)      Critically ill patients:  140 - 180 mg/dL  Results for [name], Coraleigh J (MRN [id number]) as of 09/27/2015 09:11  Ref. Range 09/26/2015 07:32 09/26/2015 11:22 09/26/2015 16:10 09/26/2015 20:55 09/27/2015 07:41  Glucose-Capillary Latest Ref Range: 65-99 mg/dL 347292 (H) 425389 (H) 956295 (H) 363 (H) 429 (H)   Review of Glycemic Control  Diabetes history: DM2 Outpatient Diabetes medications: Metformin 500 mg BID, 70/30 90 units BID (with breakfast and supper) Current orders for Inpatient glycemic control: Novolog 0-15 units TID with meals, Novolog 0-5 units QHS  Inpatient Diabetes Program Recommendations: Insulin - Basal: Glucose ranged from 292-389 mg/dl on 38/75/6412/26/16 and fasting glucose is 429 mg/dl this morning. Please consider ordering Levemir 20 units daily (starting now). Insulin - Meal Coverage: Please consider ordering Novolog 8 units TID with meals for meal coverage (in additon to Novolog correction scale). HgbA1C: Please add an A1C to blood in lab to evaluate glycemic control over the past 2-3 months.  09/27/15@13 :31-Talked with patient regarding glycemic control and outpatient regimen. Patient states that she takes Novolin 70/30 90 units BID and Metformin 500 mg BID for glycemic control. [facility] and uses the generic 70/30 insulin. Patient reports that her glucose usually runs between 150-225 mg/dl most of the time. Will continue to follow while inpatient and make further recommendations as more data is collected.  Thanks, Orlando PennerMarie Carmino Ocain, RN, MSN, CDE Diabetes Coordinator Inpatient Diabetes Program [phone] (Team Pager from 8am to 5pm) [phone] ([facility] office) [phone] [facility] office) [phone] [facility]  office)

## 2015-09-27 NOTE — Progress Notes (Signed)
Pts BP still elevated:  Filed Vitals:   09/27/15 1207 09/27/15 1347  BP: 184/74 189/83  Pulse: 69 70  Temp:    Resp:     MD paged and made aware and to add PO BP meds. Assess BP over next two hours, if trends down, proceed with discharge.

## 2015-09-28 LAB — CULTURE, BLOOD (ROUTINE X 2)
Culture: NO GROWTH
Culture: NO GROWTH

## 2015-09-29 LAB — CULTURE, BLOOD (ROUTINE X 2)
CULTURE: NO GROWTH
Culture: NO GROWTH

## 2015-10-12 LAB — MISC LABCORP TEST (SEND OUT): LABCORP TEST CODE: 9985

## 2015-10-18 NOTE — Discharge Summary (Signed)
[facility] [place] [facility]   PATIENT NAME: Bianca Hill    MR#:  [id number]  DATE OF BIRTH:  07-25-1976  DATE OF ADMISSION:  09/24/2015 ADMITTING PHYSICIAN: Ramonita Lab, MD  DATE OF DISCHARGE: 09/27/2015  2:55 PM  PRIMARY CARE PHYSICIAN: No PCP Per Patient    ADMISSION DIAGNOSIS:  Shortness of breath [R06.02]  DISCHARGE DIAGNOSIS:  Active Problems:   Healthcare-associated pneumonia   SECONDARY DIAGNOSIS:   Past Medical History  Diagnosis Date  . Diabetes mellitus without complication (HCC)   . Hypertension   . Hypercholesteremia      ADMITTING HISTORY  Bianca Hill is a 40 y.o. female with a known history of diver's mellitus, hypertension, hyperlipidemia with 40-pack-year history of smoking, still continues to smoke is presenting to the ED with a chief complaint of worsening of shortness of breath and productive cough. patient came into the ED 3 times in this week so far because of her shortness of breath. On last Wednesday she was started on antibiotics amoxicillin and azithromycin and was sent home. Patient is not feeling better and started wheezing with productive cough. Repeat chest x-ray has revealed diffuse peribronchial a space opacities   HOSPITAL COURSE:    1. Acute respiratory failure secondary to pneumonia. And acute COPD exasperation.  CT suggestive atypical pneumonia at this time I'll change her to azithromycin, recently treated with Levaquin as outpatient continue cefepime for now. Discontinue vancomycin.   2. Acute on chronic exacerbation of COPD-patient has 40-pack-year history of smoking Continue nebulizers   3. Acute kidney injury Continue to monitor renal function closely her BNP was only slightly elevated so she is not fluid overloaded. Her echo recently showed no evidence of systolic or diastolic dysfunction.  4. History of diabetes mellitus -non-insulin-dependent  hold metformin at this time Sliding scale  insulin  5. Malignant hypertension Continue IV hydralazine, continue Lopressor.  6. Nicotine addiction smoking cessation provided  Patient was slowly improving with the inpatient treatment. On 09/27/2015 patient decided to leave against medical advise. I saw and examined patient . Then discussed over phone but patient signed her papers and left.   CONSULTS OBTAINED:     DRUG ALLERGIES:  No Known Allergies  DISCHARGE MEDICATIONS:   Discharge Medication List as of 09/27/2015 11:12 AM    START taking these medications   Details  glipiZIDE (GLUCOTROL) 5 MG tablet Take 0.5 tablets (2.5 mg total) by mouth 2 (two) times daily before a meal., Starting 09/27/2015, Until Discontinued, Print    levofloxacin (LEVAQUIN) 250 MG tablet Take 3 tablets (750 mg total) by mouth daily., Starting 09/27/2015, Until Discontinued, Print    predniSONE (DELTASONE) 50 MG tablet Take 1 tablet (50 mg total) by mouth daily with breakfast., Starting 09/27/2015, Until Discontinued, Print      CONTINUE these medications which have NOT CHANGED   Details  albuterol (PROVENTIL HFA;VENTOLIN HFA) 108 (90 BASE) MCG/ACT inhaler Inhale 2 puffs into the lungs every 6 (six) hours as needed for wheezing or shortness of breath., Starting 09/22/2015, Until Discontinued, Print    hydrochlorothiazide (HYDRODIURIL) 25 MG tablet Take 1 tablet (25 mg total) by mouth daily., Starting 09/20/2015, Until Discontinued, Print    lisinopril (PRINIVIL,ZESTRIL) 20 MG tablet Take 1 tablet (20 mg total) by mouth daily., Starting 09/20/2015, Until Wed 09/19/16, Print    metoprolol (LOPRESSOR) 50 MG tablet Take 1 tablet (50 mg total) by mouth 2 (two) times daily., Starting 09/20/2015, Until Wed 09/19/16, Print    simvastatin (  ZOCOR) 20 MG tablet Take 1 tablet (20 mg total) by mouth daily., Starting 09/20/2015, Until Discontinued, Print    furosemide (LASIX) 20 MG tablet Take 1 tablet (20 mg total) by mouth daily., Starting 09/20/2015,  Until Wed 09/19/16, Print      STOP taking these medications     amoxicillin (AMOXIL) 500 MG tablet      azithromycin (ZITHROMAX) 500 MG tablet      metFORMIN (GLUCOPHAGE) 500 MG tablet          Today    VITAL SIGNS:  Blood pressure 189/83, pulse 70, temperature 97.6 F (36.4 C), temperature source Oral, resp. rate 18, height 6' (1.829 m), weight 158.759 kg (350 lb), last menstrual period 08/29/2015, SpO2 96 %.  I/O:  No intake or output data in the 24 hours ending 10/18/15 1749  PHYSICAL EXAMINATION:  Physical Exam  GENERAL:  40 y.o.-year-old patient lying in the bed with no acute distress.  LUNGS: Normal breath sounds bilaterally, no wheezing, rales,rhonchi or crepitation. No use of accessory muscles of respiration.  CARDIOVASCULAR: S1, S2 normal. No murmurs, rubs, or gallops.  ABDOMEN: Soft, non-tender, non-distended. Bowel sounds present. No organomegaly or mass.  NEUROLOGIC: Moves all 4 extremities. PSYCHIATRIC: The patient is alert and oriented x 3.  SKIN: No obvious rash, lesion, or ulcer.   DATA REVIEW:   CBC No results for input(s): WBC, HGB, HCT, PLT in the last 168 hours.  Chemistries  No results for input(s): NA, K, CL, CO2, GLUCOSE, BUN, CREATININE, CALCIUM, MG, AST, ALT, ALKPHOS, BILITOT in the last 168 hours.  Invalid input(s): GFRCGP  Cardiac Enzymes No results for input(s): TROPONINI in the last 168 hours.  Microbiology Results  Results for orders placed or performed during the hospital encounter of 09/24/15  Culture, blood (routine x 2)     Status: None   Collection Time: 09/24/15  8:22 PM  Result Value Ref Range Status   Specimen Description BLOOD RIGHT ASSIST CONTROL  Final   Special Requests BOTTLES DRAWN AEROBIC AND ANAEROBIC 4CC  Final   Culture NO GROWTH 5 DAYS  Final   Report Status 09/29/2015 FINAL  Final  Culture, blood (routine x 2)     Status: None   Collection Time: 09/24/15  8:40 PM  Result Value Ref Range Status   Specimen  Description BLOOD RIGHT ASSIST CONTROL  Final   Special Requests BOTTLES DRAWN AEROBIC AND ANAEROBIC 4CC  Final   Culture NO GROWTH 5 DAYS  Final   Report Status 09/29/2015 FINAL  Final  MRSA PCR Screening     Status: None   Collection Time: 09/25/15 12:53 AM  Result Value Ref Range Status   MRSA by PCR NEGATIVE NEGATIVE Final    Comment:        The GeneXpert MRSA Assay (FDA approved for NASAL specimens only), is one component of a comprehensive MRSA colonization surveillance program. It is not intended to diagnose MRSA infection nor to guide or monitor treatment for MRSA infections.     RADIOLOGY:  No results found.    Follow up with PCP in 1 week.  Management plans discussed with the patient, family and they are in agreement.  CODE STATUS:  Code Status History    Date Active Date Inactive Code Status Order ID Comments User Context   09/24/2015 11:54 PM 09/27/2015  5:55 PM Full Code [id number]  Ramonita Lab, MD Inpatient      TOTAL TIME TAKING CARE OF THIS PATIENT ON DAY OF DISCHARGE:  more than 30 minutes.    Milagros Loll R M.D on 10/18/2015 at 5:49 PM  Between 7am to 6pm - Pager - [phone]  After 6pm go to [email or web address] - password [email or web address] [facility]  [facility]  Office  [phone]  CC: Primary care physician; No PCP Per Patient     Note: This dictation was prepared with Dragon dictation along with smaller phrase technology. Any transcriptional errors that result from this process are unintentional.

## 2015-10-21 ENCOUNTER — Inpatient Hospital Stay: Payer: Self-pay | Admitting: Internal Medicine

## 2015-10-21 ENCOUNTER — Encounter: Payer: Self-pay | Admitting: *Deleted

## 2015-11-06 ENCOUNTER — Emergency Department
Admission: EM | Admit: 2015-11-06 | Discharge: 2015-11-06 | Disposition: A | Discharge status: Home / Self Care | Payer: Medicaid Other | Attending: Emergency Medicine | Admitting: Emergency Medicine

## 2015-11-06 ENCOUNTER — Emergency Department: Payer: Medicaid Other

## 2015-11-06 DIAGNOSIS — R2243 Localized swelling, mass and lump, lower limb, bilateral: Secondary | ICD-10-CM | POA: Diagnosis present

## 2015-11-06 DIAGNOSIS — Z7984 Long term (current) use of oral hypoglycemic drugs: Secondary | ICD-10-CM | POA: Diagnosis not present

## 2015-11-06 DIAGNOSIS — Z794 Long term (current) use of insulin: Secondary | ICD-10-CM | POA: Insufficient documentation

## 2015-11-06 DIAGNOSIS — R600 Localized edema: Secondary | ICD-10-CM | POA: Insufficient documentation

## 2015-11-06 DIAGNOSIS — F17210 Nicotine dependence, cigarettes, uncomplicated: Secondary | ICD-10-CM | POA: Insufficient documentation

## 2015-11-06 DIAGNOSIS — R0602 Shortness of breath: Secondary | ICD-10-CM | POA: Diagnosis not present

## 2015-11-06 DIAGNOSIS — E119 Type 2 diabetes mellitus without complications: Secondary | ICD-10-CM | POA: Insufficient documentation

## 2015-11-06 DIAGNOSIS — I10 Essential (primary) hypertension: Secondary | ICD-10-CM | POA: Diagnosis not present

## 2015-11-06 DIAGNOSIS — Z79899 Other long term (current) drug therapy: Secondary | ICD-10-CM | POA: Diagnosis not present

## 2015-11-06 DIAGNOSIS — IMO0001 Reserved for inherently not codable concepts without codable children: Secondary | ICD-10-CM

## 2015-11-06 DIAGNOSIS — Z7952 Long term (current) use of systemic steroids: Secondary | ICD-10-CM | POA: Insufficient documentation

## 2015-11-06 DIAGNOSIS — R609 Edema, unspecified: Secondary | ICD-10-CM

## 2015-11-06 DIAGNOSIS — Z792 Long term (current) use of antibiotics: Secondary | ICD-10-CM | POA: Insufficient documentation

## 2015-11-06 LAB — URINALYSIS COMPLETE WITH MICROSCOPIC (ARMC ONLY)
BILIRUBIN URINE: NEGATIVE
Leukocytes, UA: NEGATIVE
NITRITE: NEGATIVE
Protein, ur: >500 mg/dL — AB
SPECIFIC GRAVITY, URINE: 1.016 (ref 1.005–1.030)
pH: 6 (ref 5.0–8.0)

## 2015-11-06 LAB — COMPREHENSIVE METABOLIC PANEL
ALT: 23 U/L (ref 14–54)
AST: 23 U/L (ref 15–41)
Albumin: 1.8 g/dL — ABNORMAL LOW (ref 3.5–5.0)
Alkaline Phosphatase: 69 U/L (ref 38–126)
Anion gap: 6 (ref 5–15)
BILIRUBIN TOTAL: 0.3 mg/dL (ref 0.3–1.2)
BUN: 23 mg/dL — AB (ref 6–20)
CALCIUM: 8.6 mg/dL — AB (ref 8.9–10.3)
CO2: 24 mmol/L (ref 22–32)
CREATININE: 1.61 mg/dL — AB (ref 0.44–1.00)
Chloride: 102 mmol/L (ref 101–111)
GFR, EST AFRICAN AMERICAN: 46 mL/min — AB (ref >60)
GFR, EST NON AFRICAN AMERICAN: 40 mL/min — AB (ref >60)
Glucose, Bld: 376 mg/dL — ABNORMAL HIGH (ref 65–99)
Potassium: 3.9 mmol/L (ref 3.5–5.1)
Sodium: 132 mmol/L — ABNORMAL LOW (ref 135–145)
TOTAL PROTEIN: 5.3 g/dL — AB (ref 6.5–8.1)

## 2015-11-06 LAB — CBC WITH DIFFERENTIAL/PLATELET
BASOS ABS: 0.1 10*3/uL (ref 0–0.1)
BASOS PCT: 1 %
EOS ABS: 0.2 10*3/uL (ref 0–0.7)
EOS PCT: 3 %
HCT: 43.9 % (ref 35.0–47.0)
Hemoglobin: 14.4 g/dL (ref 12.0–16.0)
Lymphocytes Relative: 22 %
Lymphs Abs: 1.9 10*3/uL (ref 1.0–3.6)
MCH: 30.9 pg (ref 26.0–34.0)
MCHC: 32.8 g/dL (ref 32.0–36.0)
MCV: 94.3 fL (ref 80.0–100.0)
MONO ABS: 0.6 10*3/uL (ref 0.2–0.9)
Monocytes Relative: 7 %
Neutro Abs: 6.1 10*3/uL (ref 1.4–6.5)
Neutrophils Relative %: 67 %
PLATELETS: 204 10*3/uL (ref 150–440)
RBC: 4.65 MIL/uL (ref 3.80–5.20)
RDW: 13.9 % (ref 11.5–14.5)
WBC: 8.9 10*3/uL (ref 3.6–11.0)

## 2015-11-06 LAB — TSH: TSH: 1.876 u[IU]/mL (ref 0.350–4.500)

## 2015-11-06 LAB — TROPONIN I

## 2015-11-06 MED ORDER — FUROSEMIDE 10 MG/ML IJ SOLN
40.0000 mg | Freq: Once | INTRAMUSCULAR | Status: AC
  Administered 2015-11-06: 40 mg via INTRAVENOUS
  Filled 2015-11-06: qty 4

## 2015-11-06 MED ORDER — FUROSEMIDE 20 MG PO TABS: 20.0000 mg | ORAL_TABLET | Freq: Every day | ORAL | Status: AC

## 2015-11-06 MED ORDER — LISINOPRIL 20 MG PO TABS: 20.0000 mg | ORAL_TABLET | Freq: Every day | ORAL | Status: AC

## 2015-11-06 MED ORDER — METFORMIN HCL 500 MG PO TABS: 500.0000 mg | ORAL_TABLET | Freq: Two times a day (BID) | ORAL | Status: AC

## 2015-11-06 MED ORDER — HYDROCHLOROTHIAZIDE 25 MG PO TABS: 25.0000 mg | ORAL_TABLET | Freq: Every day | ORAL | Status: AC

## 2015-11-06 NOTE — ED Provider Notes (Addendum)
[facility] Emergency Department Provider Note  ____________________________________________  Time seen: 11:00 AM  I have reviewed the triage vital signs and the nursing notes.   HISTORY  Chief Complaint Leg Swelling    HPI Bianca Hill is a 40 y.o. female who complains of bilateral lower extremity swelling. She reports that she's had swelling of her feet and ankles before but never this bad where his including both of her bilateral lower extremities as well as her abdominal wall. She denies any orthopnea or PND. Does feel like she gets short of breath when she walks around. Denies any chest pain whatsoever. No pleuritic symptoms. He does not currently have a primary care doctor due to loss of insurance. She is been off of hydrochlorothiazide but has been taking a family members Lasix.     Past Medical History  Diagnosis Date  . Diabetes mellitus without complication (HCC)   . Hypertension   . Hypercholesteremia      Patient Active Problem List   Diagnosis Date Noted  . Healthcare-associated pneumonia 09/24/2015  . IDDM (insulin dependent diabetes mellitus) (HCC) 02/09/2013     Past Surgical History  Procedure Laterality Date  . Refractive surgery       Current Outpatient Rx  Name  Route  Sig  Dispense  Refill  . albuterol (PROVENTIL HFA;VENTOLIN HFA) 108 (90 BASE) MCG/ACT inhaler   Inhalation   Inhale 2 puffs into the lungs every 6 (six) hours as needed for wheezing or shortness of breath.   1 Inhaler   2   . insulin NPH-regular Human (NOVOLIN 70/30) (70-30) 100 UNIT/ML injection   Subcutaneous   Inject 90 Units into the skin 2 (two) times daily with a meal.         . metoprolol (LOPRESSOR) 50 MG tablet   Oral   Take 1 tablet (50 mg total) by mouth 2 (two) times daily.   60 tablet   0   . simvastatin (ZOCOR) 20 MG tablet   Oral   Take 1 tablet (20 mg total) by mouth daily.   30 tablet   0   . furosemide (LASIX) 20 MG  tablet   Oral   Take 1 tablet (20 mg total) by mouth daily.   30 tablet   2   . glipiZIDE (GLUCOTROL) 5 MG tablet   Oral   Take 0.5 tablets (2.5 mg total) by mouth 2 (two) times daily before a meal.   60 tablet   0   . hydrALAZINE (APRESOLINE) 100 MG tablet   Oral   Take 1 tablet (100 mg total) by mouth 3 (three) times daily.   90 tablet   0   . hydrochlorothiazide (HYDRODIURIL) 25 MG tablet   Oral   Take 1 tablet (25 mg total) by mouth daily.   30 tablet   2   . levofloxacin (LEVAQUIN) 250 MG tablet   Oral   Take 3 tablets (750 mg total) by mouth daily.   6 tablet   0   . lisinopril (PRINIVIL,ZESTRIL) 20 MG tablet   Oral   Take 1 tablet (20 mg total) by mouth daily.   30 tablet   2   . metFORMIN (GLUCOPHAGE) 500 MG tablet   Oral   Take 1 tablet (500 mg total) by mouth 2 (two) times daily with a meal.   60 tablet   2   . predniSONE (DELTASONE) 50 MG tablet   Oral   Take 1 tablet (50  mg total) by mouth daily with breakfast.   4 tablet   0      Allergies Review of patient's allergies indicates no known allergies.   No family history on file.  Social History Social History  Substance Use Topics  . Smoking status: Current Every Day Smoker -- 1.00 packs/day    Types: Cigarettes  . Smokeless tobacco: Not on file  . Alcohol Use: No    Review of Systems  Constitutional:   No fever or chills. No weight changes Eyes:   No blurry vision or double vision.  ENT:   No sore throat. Cardiovascular:   No chest pain. Respiratory:   Positive shortness of breath Gastrointestinal:   Negative for abdominal pain, vomiting and diarrhea.  No BRBPR or melena. Genitourinary:   Negative for dysuria, urinary retention, bloody urine, or difficulty urinating. Musculoskeletal:   Negative for back pain. Positive bilateral edema. Skin:   Negative for rash. Neurological:   Negative for headaches, focal weakness or numbness. Psychiatric:  No anxiety or depression.    Endocrine:  No hot/cold intolerance, changes in energy, or sleep difficulty.  10-point ROS otherwise negative.  ____________________________________________   PHYSICAL EXAM:  VITAL SIGNS: ED Triage Vitals  Enc Vitals Group     BP 11/06/15 1041 205/98 mmHg     Pulse Rate 11/06/15 1041 72     Resp 11/06/15 1041 20     Temp 11/06/15 1041 98.5 F (36.9 C)     Temp Source 11/06/15 1041 Oral     SpO2 11/06/15 1041 98 %     Weight 11/06/15 1041 325 lb (147.419 kg)     Height 11/06/15 1041 6' (1.829 m)     Head Cir --      Peak Flow --      Pain Score --      Pain Loc --      Pain Edu? --      Excl. in GC? --     Vital signs reviewed, nursing assessments reviewed.   Constitutional:   Alert and oriented. Well appearing and in no distress. Eyes:   No scleral icterus. No conjunctival pallor. PERRL. EOMI ENT   Head:   Normocephalic and atraumatic.   Nose:   No congestion/rhinnorhea. No septal hematoma   Mouth/Throat:   MMM, no pharyngeal erythema. No peritonsillar mass. No uvula shift.   Neck:   No stridor. No SubQ emphysema. No meningismus. No JVD while lying supine. Negative hepatojugular reflex Hematological/Lymphatic/Immunilogical:   No cervical lymphadenopathy. Cardiovascular:   RRR. Normal and symmetric distal pulses are present in all extremities. No murmurs, rubs, or gallops. Respiratory:   Normal respiratory effort without tachypnea nor retractions. Breath sounds are clear and equal bilaterally. No wheezes/rales/rhonchi. No orthopnea Gastrointestinal:   Soft and nontender. No distention. There is no CVA tenderness.  No rebound, rigidity, or guarding. Abdominal wall is a edematous Genitourinary:   deferred Musculoskeletal:   Nontender with normal range of motion in all extremities. No joint effusions.  No lower extremity tenderness.  2+ pitting edema bilaterally in lower extremities. No erythema or evidence of cellulitis. There is brawny skin thickening  consistent with chronic edema. Neurologic:   Normal speech and language.  CN 2-10 normal. Motor grossly intact. No gross focal neurologic deficits are appreciated.  Skin:    Skin is warm, dry and intact. No rash noted.  No petechiae, purpura, or bullae. Psychiatric:   Mood and affect are normal. Speech and behavior are normal. Patient exhibits  appropriate insight and judgment.  ____________________________________________    LABS (pertinent positives/negatives) (all labs ordered are listed, but only abnormal results are displayed) Labs Reviewed  COMPREHENSIVE METABOLIC PANEL - Abnormal; Notable for the following:    Sodium 132 (*)    Glucose, Bld 376 (*)    BUN 23 (*)    Creatinine, Ser 1.61 (*)    Calcium 8.6 (*)    Total Protein 5.3 (*)    Albumin 1.8 (*)    GFR calc non Af Amer 40 (*)    GFR calc Af Amer 46 (*)    All other components within normal limits  URINALYSIS COMPLETEWITH MICROSCOPIC ([facility] ONLY) - Abnormal; Notable for the following:    Color, Urine YELLOW (*)    APPearance HAZY (*)    Glucose, UA >500 (*)    Ketones, ur TRACE (*)    Hgb urine dipstick 1+ (*)    Protein, ur >500 (*)    Bacteria, UA RARE (*)    Squamous Epithelial / LPF 6-30 (*)    All other components within normal limits  TROPONIN I  CBC WITH DIFFERENTIAL/PLATELET  TSH   ____________________________________________   EKG  Interpreted by me Sinus rhythm rate of 70, right axis, normal intervals. Normal QRS and ST segments.  T wave inversions in leads 3 and aVF.  ____________________________________________    RADIOLOGY  Chest x-ray unremarkable  ____________________________________________   PROCEDURES   ____________________________________________   INITIAL IMPRESSION / ASSESSMENT AND PLAN / ED COURSE  Pertinent labs & imaging results that were available during my care of the patient were reviewed by me and considered in my medical decision making (see chart for  details).  Patient presents with dyspnea on exertion and worsening peripheral edema. This appears to be a chronic issue although the patient states that it is new, it appears to be the same thing that she presented to the emergency Department with on December 19 related to medication noncompliance. We'll restart her medications. We'll check labs due to new T-wave inversions. Her symptoms do not sound like ACS, and I have low suspicion that this is an anginal equivalent. Troponin is negative I think that we can attribute it to heart strain. She also recently had an echocardiogram which was essentially unremarkable. Her weight today is 25 pounds less than it was in December.  ----------------------------------------- 1:43 PM on 11/06/2015 -----------------------------------------  Workup negative. Will refill patient's medications including antihypertensives and diuretics. We'll counsel patient that these medications are actually very inexpensive to buy even if paying cash without insurance.Considering the patient's symptoms, medical history, and physical examination today, I have low suspicion for ACS, PE, TAD, pneumothorax, carditis, mediastinitis, pneumonia, CHF, or sepsis.       ____________________________________________   FINAL CLINICAL IMPRESSION(S) / ED DIAGNOSES  Final diagnoses:  Peripheral edema  IDDM (insulin dependent diabetes mellitus) (HCC)      Sharman Cheek, MD 11/06/15 1344  Sharman Cheek, MD 11/06/15 [phone]

## 2015-11-06 NOTE — ED Notes (Signed)
Pt transported to xray 

## 2015-11-06 NOTE — Discharge Instructions (Signed)
Basics of Medicine Management WHAT SHOULD I DO WHEN I AM TAKING MEDICINES?   Read all of the labels and the inserts that come with your medicines. Review the information often.  Talk with your pharmacist if you notice a change in the size, color, or shape of your medicines.  Try to get all of your medicines at one pharmacy. The pharmacist will have all your information and will understand possible drug interactions.  Ask your health care provider any questions that you have about your prescribed medicines and any over-the-counter medicines, vitamins, and herbal or dietary supplements that you take. It is important to make sure that nothing will interact with any of your prescribed medicines. WHAT SHOULD I KNOW ABOUT MY MEDICINES?  Know the potential side effects for each medicine that you take.  Know what each of your medicines looks like. This includes size, color, and shape.  If you are getting confused and having trouble recognizing your different medicines, ask your health care provider or pharmacist about changing your medicines or helping you to identify them more easily. HOW CAN I TAKE MY MEDICINES SAFELY?  Take medicines only as directed by your health care provider.  Do not take more of your medicine than instructed.  Do not take anyone else's medicines.  Do not share your medicines with other people.  Do not stop taking your medicines unless you have talked about that with your health care provider.  You may need to avoid alcohol or certain foods or liquids with one or more of your medicines. Follow your health care provider's instructions.  Do not split, mash, or chew your medicines unless your health care provider tells you to do so. Tell your health care provider if you have trouble swallowing your medicines.  For every liquid medicine, use the dosing container that was provided. HOW SHOULD I ORGANIZE MY MEDICINES?  Use a tool, such as a weekly pillbox, a written  chart from your health care provider, a notebook, or your own calendar to organize your medicine schedule.  If you have trouble recognizing your different medicines, keep them in their original bottles.  Create reminders for taking your medicines. Use sticky notes, or use alarms on your watch, mobile device, or phone calendar.  Your organization system should help you to remember the following information about each medicine:  Name of the medicine.  Dosage.  Schedule. This includes the day and time when it should be taken.  Appearance. This includes color, shape, size, and stamp.  How to take your medicines. You may need to take them with or without certain foods, on an empty stomach, with fluids, or by following some other instruction.  More advanced medicine management systems are also available. These offer weekly or monthly options that are complete with storage, alarms, and visual and audio prompts.  Review your medicine schedule with a family member, friend, or caregiver. Other household members should understand your medicines.  If you have trouble reading the names of your different medicines, ask your pharmacist to provide your medicines in containers with large print.  If you take any medicines on an "as needed" basis, such as medicines for nausea or pain, it is important that you remember what you have taken and when you did so. Write down the following information each time you take an "as needed" medicine: the name, the dosage, and the date and time that you took it. HOW SHOULD I PLAN AHEAD FOR TRAVEL?  Take your pillbox, medicines, and  organization system with you when you travel.   Have your medicines refilled before you leave for travel. This will ensure that you do not run out of your medicines while you are away from home.  Always carry an updated list of your medicines with you. If there is an emergency, a respondent can quickly see what medicines you are  taking. HOW SHOULD I STORE AND DISCARD MY MEDICINES?  Store medicines in a cool, dry area away from light or as directed by your pharmacist or health care provider. The bathroom is not a good place for medicine storage because of heat and humidity.  Store your medicines away from chemicals, medicines for your pet, and medicines of other household members.  Keep medicines where children cannot reach them. Do not leave them on counters or bedside tables. Store them in high cabinets or on high shelves.  Check expiration dates regularly. Do not take expired medicines. Discard medicines that are older than the expiration date.  Learn about the best way to dispose of each medicine that you take. Find out if your local government recycling program, hospital, or pharmacy has a medicine take-back program for safe disposal. If not, some medicines may be mixed with inedible substances and thrown away in the trash in a sealed bag or empty container. WHAT SHOULD I REMEMBER?  Tell your health care provider if you experience side effects, you have new symptoms, or you have other concerns. There may be dosing changes or alternative medicines that would be better for you.  Review your medicines regularly with your health care provider. Ask if you need to continue to take each medicine, and discuss how well each one is working. Medicines, diet, medical conditions, weight changes, and other habits can all affect how medicines work.  Refill your medicines early so that you do not risk running out.  In case of an accidental overdose, call your local Poison Control Center at [phone] or visit your local emergency department immediately. This is important. WHAT SHOULD I KNOW ABOUT GIVING MEDICINES TO MY CHILD?  Use positive reinforcement to help your child take necessary medicines. Try singing, cuddling, and rewards.  Use only the syringes, droppers, dosing spoons, or dosing cups from your child's health  care provider or pharmacist.  Always wash your hands before giving medicines.  Learn about the medicine policies at your child's school.  Meet with the school nurse to review your child's medicine schedule in detail.  Do not send oral medicines to school with your child.  If your child has trouble taking medicine, forgets a dose, or spits it up, talk with his or her health care provider.  Do not give over-the-counter cough and cold medicines to your child who is younger than 66 years old, unless directed by his or her health care provider.  Do not give your child aspirin unless instructed to do so by your child's pediatrician or cardiologist.  Make sure that your child knows how to use an inhaler properly, if needed.   This information is not intended to replace advice given to you by your health care provider. Make sure you discuss any questions you have with your health care provider.   Document Released: 01/02/2011 Document Revised: 10/08/2014 Document Reviewed: 05/20/2014 Elsevier Interactive Patient Education 2016 [employer]  Edema Edema is an abnormal buildup of fluids in your bodytissues. Edema is somewhatdependent on gravity to pull the fluid to the lowest place in your body. That makes the condition more common  in the legs and thighs (lower extremities). Painless swelling of the feet and ankles is common and becomes more likely as you get older. It is also common in looser tissues, like around your eyes.  When the affected area is squeezed, the fluid may move out of that spot and leave a dent for a few moments. This dent is called pitting.  CAUSES  There are many possible causes of edema. Eating too much salt and being on your feet or sitting for a long time can cause edema in your legs and ankles. Hot weather may make edema worse. Common medical causes of edema include:  Heart failure.  Liver disease.  Kidney disease.  Weak blood vessels in your  legs.  Cancer.  An injury.  Pregnancy.  Some medications.  Obesity. SYMPTOMS  Edema is usually painless.Your skin may look swollen or shiny.  DIAGNOSIS  Your health care provider may be able to diagnose edema by asking about your medical history and doing a physical exam. You may need to have tests such as X-rays, an electrocardiogram, or blood tests to check for medical conditions that may cause edema.  TREATMENT  Edema treatment depends on the cause. If you have heart, liver, or kidney disease, you need the treatment appropriate for these conditions. General treatment may include:  Elevation of the affected body part above the level of your heart.  Compression of the affected body part. Pressure from elastic bandages or support stockings squeezes the tissues and forces fluid back into the blood vessels. This keeps fluid from entering the tissues.  Restriction of fluid and salt intake.  Use of a water pill (diuretic). These medications are appropriate only for some types of edema. They pull fluid out of your body and make you urinate more often. This gets rid of fluid and reduces swelling, but diuretics can have side effects. Only use diuretics as directed by your health care provider. HOME CARE INSTRUCTIONS   Keep the affected body part above the level of your heart when you are lying down.   Do not sit still or stand for prolonged periods.   Do not put anything directly under your knees when lying down.  Do not wear constricting clothing or garters on your upper legs.   Exercise your legs to work the fluid back into your blood vessels. This may help the swelling go down.   Wear elastic bandages or support stockings to reduce ankle swelling as directed by your health care provider.   Eat a low-salt diet to reduce fluid if your health care provider recommends it.   Only take medicines as directed by your health care provider. SEEK MEDICAL CARE IF:   Your edema is  not responding to treatment.  You have heart, liver, or kidney disease and notice symptoms of edema.  You have edema in your legs that does not improve after elevating them.   You have sudden and unexplained weight gain. SEEK IMMEDIATE MEDICAL CARE IF:   You develop shortness of breath or chest pain.   You cannot breathe when you lie down.  You develop pain, redness, or warmth in the swollen areas.   You have heart, liver, or kidney disease and suddenly get edema.  You have a fever and your symptoms suddenly get worse. MAKE SURE YOU:   Understand these instructions.  Will watch your condition.  Will get help right away if you are not doing well or get worse.   This information is not intended  to replace advice given to you by your health care provider. Make sure you discuss any questions you have with your health care provider.   Document Released: 09/17/2005 Document Revised: 10/08/2014 Document Reviewed: 07/10/2013 Elsevier Interactive Patient Education 2016 [employer]  Hyperglycemia High blood sugar (hyperglycemia) means that the level of sugar in your blood is higher than it should be. Signs of high blood sugar include:  Feeling thirsty.  Frequent peeing (urinating).  Feeling tired or sleepy.  Dry mouth.  Vision changes.  Feeling weak.  Feeling hungry but losing weight.  Numbness and tingling in your hands or feet.  Headache. When you ignore these signs, your blood sugar may keep going up. These problems may get worse, and other problems may begin. HOME CARE  Check your blood sugars as told by your doctor. Write down the numbers with the date and time.  Take the right amount of insulin or diabetes pills at the right time. Write down the dose with date and time.  Refill your insulin or diabetes pills before running out.  Watch what you eat. Follow your meal plan.  Drink liquids without sugar, such as water. Check with your doctor if you have  kidney or heart disease.  Follow your doctor's orders for exercise. Exercise at the same time of day.  Keep your doctor's appointments. GET HELP RIGHT AWAY IF:   You have trouble thinking or are confused.  You have fast breathing with fruity smelling breath.  You pass out (faint).  You have 2 to 3 days of high blood sugars and you do not know why.  You have chest pain.  You are feeling sick to your stomach (nauseous) or throwing up (vomiting).  You have sudden vision changes. MAKE SURE YOU:   Understand these instructions.  Will watch your condition.  Will get help right away if you are not doing well or get worse.   This information is not intended to replace advice given to you by your health care provider. Make sure you discuss any questions you have with your health care provider.   Document Released: 07/15/2009 Document Revised: 10/08/2014 Document Reviewed: 05/24/2015 Elsevier Interactive Patient Education 2016 [employer]  Type 2 Diabetes Mellitus, Adult Type 2 diabetes mellitus, often simply referred to as type 2 diabetes, is a long-lasting (chronic) disease. In type 2 diabetes, the pancreas does not make enough insulin (a hormone), the cells are less responsive to the insulin that is made (insulin resistance), or both. Normally, insulin moves sugars from food into the tissue cells. The tissue cells use the sugars for energy. The lack of insulin or the lack of normal response to insulin causes excess sugars to build up in the blood instead of going into the tissue cells. As a result, high blood sugar (hyperglycemia) develops. The effect of high sugar (glucose) levels can cause many complications. Type 2 diabetes was also previously called adult-onset diabetes, but it can occur at any age.  RISK FACTORS  A person is predisposed to developing type 2 diabetes if someone in the family has the disease and also has one or more of the following primary risk  factors:  Weight gain, or being overweight or obese.  An inactive lifestyle.  A history of consistently eating high-calorie foods. Maintaining a normal weight and regular physical activity can reduce the chance of developing type 2 diabetes. SYMPTOMS  A person with type 2 diabetes may not show symptoms initially. The symptoms of type 2 diabetes appear slowly.  The symptoms include:  Increased thirst (polydipsia).  Increased urination (polyuria).  Increased urination during the night (nocturia).  Sudden or unexplained weight changes.  Frequent, recurring infections.  Tiredness (fatigue).  Weakness.  Vision changes, such as blurred vision.  Fruity smell to your breath.  Abdominal pain.  Nausea or vomiting.  Cuts or bruises which are slow to heal.  Tingling or numbness in the hands or feet.  An open skin wound (ulcer). DIAGNOSIS Type 2 diabetes is frequently not diagnosed until complications of diabetes are present. Type 2 diabetes is diagnosed when symptoms or complications are present and when blood glucose levels are increased. Your blood glucose level may be checked by one or more of the following blood tests:  A fasting blood glucose test. You will not be allowed to eat for at least 8 hours before a blood sample is taken.  A random blood glucose test. Your blood glucose is checked at any time of the day regardless of when you ate.  A hemoglobin A1c blood glucose test. A hemoglobin A1c test provides information about blood glucose control over the previous 3 months.  An oral glucose tolerance test (OGTT). Your blood glucose is measured after you have not eaten (fasted) for 2 hours and then after you drink a glucose-containing beverage. TREATMENT   You may need to take insulin or diabetes medicine daily to keep blood glucose levels in the desired range.  If you use insulin, you may need to adjust the dosage depending on the carbohydrates that you eat with each meal  or snack.  Lifestyle changes are recommended as part of your treatment. These may include:  Following an individualized diet plan developed by a nutritionist or dietitian.  Exercising daily. Your health care providers will set individualized treatment goals for you based on your age, your medicines, how long you have had diabetes, and any other medical conditions you have. Generally, the goal of treatment is to maintain the following blood glucose levels:  Before meals (preprandial): 80-130 mg/dL.  After meals (postprandial): below 180 mg/dL.  A1c: less than 6.5-7%. HOME CARE INSTRUCTIONS   Have your hemoglobin A1c level checked twice a year.  Perform daily blood glucose monitoring as directed by your health care provider.  Monitor urine ketones when you are ill and as directed by your health care provider.  Take your diabetes medicine or insulin as directed by your health care provider to maintain your blood glucose levels in the desired range.  Never run out of diabetes medicine or insulin. It is needed every day.  If you are using insulin, you may need to adjust the amount of insulin given based on your intake of carbohydrates. Carbohydrates can raise blood glucose levels but need to be included in your diet. Carbohydrates provide vitamins, minerals, and fiber which are an essential part of a healthy diet. Carbohydrates are found in fruits, vegetables, whole grains, dairy products, legumes, and foods containing added sugars.  Eat healthy foods. You should make an appointment to see a registered dietitian to help you create an eating plan that is right for you.  Lose weight if you are overweight.  Carry a medical alert card or wear your medical alert jewelry.  Carry a 15-gram carbohydrate snack with you at all times to treat low blood glucose (hypoglycemia). Some examples of 15-gram carbohydrate snacks include:  Glucose tablets, 3 or 4.  Glucose gel, 15-gram tube.  Raisins,  2 tablespoons (24 grams).  Jelly beans, 6.  Animal crackers,  8.  Regular pop, 4 ounces (120 mL).  Gummy treats, 9.  Recognize hypoglycemia. Hypoglycemia occurs with blood glucose levels of 70 mg/dL and below. The risk for hypoglycemia increases when fasting or skipping meals, during or after intense exercise, and during sleep. Hypoglycemia symptoms can include:  Tremors or shakes.  Decreased ability to concentrate.  Sweating.  Increased heart rate.  Headache.  Dry mouth.  Hunger.  Irritability.  Anxiety.  Restless sleep.  Altered speech or coordination.  Confusion.  Treat hypoglycemia promptly. If you are alert and able to safely swallow, follow the 15:15 rule:  Take 15-20 grams of rapid-acting glucose or carbohydrate. Rapid-acting options include glucose gel, glucose tablets, or 4 ounces (120 mL) of fruit juice, regular soda, or low-fat milk.  Check your blood glucose level 15 minutes after taking the glucose.  Take 15-20 grams more of glucose if the repeat blood glucose level is still 70 mg/dL or below.  Eat a meal or snack within 1 hour once blood glucose levels return to normal.  Be alert to feeling very thirsty and urinating more frequently than usual, which are early signs of hyperglycemia. An early awareness of hyperglycemia allows for prompt treatment. Treat hyperglycemia as directed by your health care provider.  Engage in at least 150 minutes of moderate-intensity physical activity a week, spread over at least 3 days of the week or as directed by your health care provider. In addition, you should engage in resistance exercise at least 2 times a week or as directed by your health care provider. Try to spend no more than 90 minutes at one time inactive.  Adjust your medicine and food intake as needed if you start a new exercise or sport.  Follow your sick-day plan anytime you are unable to eat or drink as usual.  Do not use any tobacco products including  cigarettes, chewing tobacco, or electronic cigarettes. If you need help quitting, ask your health care provider.  Limit alcohol intake to no more than 1 drink per day for nonpregnant women and 2 drinks per day for men. You should drink alcohol only when you are also eating food. Talk with your health care provider whether alcohol is safe for you. Tell your health care provider if you drink alcohol several times a week.  Keep all follow-up visits as directed by your health care provider. This is important.  Schedule an eye exam soon after the diagnosis of type 2 diabetes and then annually.  Perform daily skin and foot care. Examine your skin and feet daily for cuts, bruises, redness, nail problems, bleeding, blisters, or sores. A foot exam by a health care provider should be done annually.  Brush your teeth and gums at least twice a day and floss at least once a day. Follow up with your dentist regularly.  Share your diabetes management plan with your workplace or school.  Keep your immunizations up to date. It is recommended that you receive a flu (influenza) vaccine every year. It is also recommended that you receive a pneumonia (pneumococcal) vaccine. If you are 15 years of age or older and have never received a pneumonia vaccine, this vaccine may be given as a series of two separate shots. Ask your health care provider which additional vaccines may be recommended.  Learn to manage stress.  Obtain ongoing diabetes education and support as needed.  Participate in or seek rehabilitation as needed to maintain or improve independence and quality of life. Request a physical or occupational therapy  referral if you are having foot or hand numbness, or difficulties with grooming, dressing, eating, or physical activity. SEEK MEDICAL CARE IF:   You are unable to eat food or drink fluids for more than 6 hours.  You have nausea and vomiting for more than 6 hours.  Your blood glucose level is over  240 mg/dL.  There is a change in mental status.  You develop an additional serious illness.  You have diarrhea for more than 6 hours.  You have been sick or have had a fever for a couple of days and are not getting better.  You have pain during any physical activity.  SEEK IMMEDIATE MEDICAL CARE IF:  You have difficulty breathing.  You have moderate to large ketone levels.   This information is not intended to replace advice given to you by your health care provider. Make sure you discuss any questions you have with your health care provider.   Document Released: 09/17/2005 Document Revised: 06/08/2015 Document Reviewed: 04/15/2012 Elsevier Interactive Patient Education [employer]

## 2015-11-06 NOTE — ED Notes (Signed)
Pt reports swelling and weeping to bilateral lower extremities that has been getting worse since December. Pt swelling has moved up to her abd and throat. Endores SOB with ambulation. Was taking her aunts Lasix  daily.

## 2015-11-14 ENCOUNTER — Ambulatory Visit: Payer: Self-pay

## 2015-11-19 ENCOUNTER — Emergency Department
Admission: EM | Admit: 2015-11-19 | Discharge: 2015-11-19 | Disposition: A | Discharge status: Home / Self Care | Payer: Medicaid Other | Attending: Emergency Medicine | Admitting: Emergency Medicine

## 2015-11-19 ENCOUNTER — Encounter: Payer: Self-pay | Admitting: Emergency Medicine

## 2015-11-19 ENCOUNTER — Emergency Department: Payer: Medicaid Other

## 2015-11-19 DIAGNOSIS — Z794 Long term (current) use of insulin: Secondary | ICD-10-CM | POA: Insufficient documentation

## 2015-11-19 DIAGNOSIS — Z792 Long term (current) use of antibiotics: Secondary | ICD-10-CM | POA: Diagnosis not present

## 2015-11-19 DIAGNOSIS — R609 Edema, unspecified: Secondary | ICD-10-CM

## 2015-11-19 DIAGNOSIS — Z7984 Long term (current) use of oral hypoglycemic drugs: Secondary | ICD-10-CM | POA: Insufficient documentation

## 2015-11-19 DIAGNOSIS — L03115 Cellulitis of right lower limb: Secondary | ICD-10-CM | POA: Insufficient documentation

## 2015-11-19 DIAGNOSIS — Z79899 Other long term (current) drug therapy: Secondary | ICD-10-CM | POA: Insufficient documentation

## 2015-11-19 DIAGNOSIS — Z7952 Long term (current) use of systemic steroids: Secondary | ICD-10-CM | POA: Insufficient documentation

## 2015-11-19 DIAGNOSIS — E119 Type 2 diabetes mellitus without complications: Secondary | ICD-10-CM | POA: Insufficient documentation

## 2015-11-19 DIAGNOSIS — I10 Essential (primary) hypertension: Secondary | ICD-10-CM | POA: Diagnosis not present

## 2015-11-19 DIAGNOSIS — R2241 Localized swelling, mass and lump, right lower limb: Secondary | ICD-10-CM | POA: Diagnosis present

## 2015-11-19 DIAGNOSIS — F17210 Nicotine dependence, cigarettes, uncomplicated: Secondary | ICD-10-CM | POA: Insufficient documentation

## 2015-11-19 LAB — COMPREHENSIVE METABOLIC PANEL
ALK PHOS: 70 U/L (ref 38–126)
ALT: 21 U/L (ref 14–54)
ANION GAP: 6 (ref 5–15)
AST: 25 U/L (ref 15–41)
Albumin: 1.8 g/dL — ABNORMAL LOW (ref 3.5–5.0)
BUN: 26 mg/dL — ABNORMAL HIGH (ref 6–20)
CALCIUM: 8.3 mg/dL — AB (ref 8.9–10.3)
CO2: 27 mmol/L (ref 22–32)
CREATININE: 1.78 mg/dL — AB (ref 0.44–1.00)
Chloride: 100 mmol/L — ABNORMAL LOW (ref 101–111)
GFR, EST AFRICAN AMERICAN: 41 mL/min — AB (ref >60)
GFR, EST NON AFRICAN AMERICAN: 35 mL/min — AB (ref >60)
Glucose, Bld: 411 mg/dL — ABNORMAL HIGH (ref 65–99)
Potassium: 3.6 mmol/L (ref 3.5–5.1)
Sodium: 133 mmol/L — ABNORMAL LOW (ref 135–145)
Total Bilirubin: 0.7 mg/dL (ref 0.3–1.2)
Total Protein: 5.3 g/dL — ABNORMAL LOW (ref 6.5–8.1)

## 2015-11-19 LAB — CBC WITH DIFFERENTIAL/PLATELET
Basophils Absolute: 0.1 10*3/uL (ref 0–0.1)
Basophils Relative: 1 %
EOS ABS: 0.3 10*3/uL (ref 0–0.7)
HCT: 45.4 % (ref 35.0–47.0)
Hemoglobin: 14.9 g/dL (ref 12.0–16.0)
LYMPHS ABS: 3 10*3/uL (ref 1.0–3.6)
MCH: 30.4 pg (ref 26.0–34.0)
MCHC: 32.7 g/dL (ref 32.0–36.0)
MCV: 93.1 fL (ref 80.0–100.0)
Monocytes Absolute: 0.9 10*3/uL (ref 0.2–0.9)
Neutro Abs: 7.8 10*3/uL — ABNORMAL HIGH (ref 1.4–6.5)
Neutrophils Relative %: 65 %
PLATELETS: 164 10*3/uL (ref 150–440)
RBC: 4.88 MIL/uL (ref 3.80–5.20)
RDW: 13 % (ref 11.5–14.5)
WBC: 12.1 10*3/uL — AB (ref 3.6–11.0)

## 2015-11-19 MED ORDER — CLINDAMYCIN PHOSPHATE 600 MG/50ML IV SOLN
600.0000 mg | Freq: Once | INTRAVENOUS | Status: AC
  Administered 2015-11-19: 600 mg via INTRAVENOUS
  Filled 2015-11-19: qty 50

## 2015-11-19 MED ORDER — CEPHALEXIN 500 MG PO CAPS: 500.0000 mg | ORAL_CAPSULE | Freq: Three times a day (TID) | ORAL | Status: DC

## 2015-11-19 NOTE — ED Notes (Signed)
States has has R leg redness x 2 days, has had bilateral keg edema since December.

## 2015-11-19 NOTE — ED Notes (Signed)
Discussed discharge instructions, prescriptions, and follow-up care with patient. No questions or concerns at this time. Pt stable at discharge.  

## 2015-11-19 NOTE — ED Provider Notes (Signed)
[facility] Emergency Department Provider Note  ____________________________________________  Time seen: 3:00 PM  I have reviewed the triage vital signs and the nursing notes.   HISTORY  Chief Complaint Leg Swelling    HPI Bianca Hill is a 40 y.o. female who complains of redness and increased pain in the right lower extremity for the past 2 days. She has a history of hypertension diabetes and severe peripheral edema. I actually saw this patient about 2 weeks ago for her leg swelling which was already chronic at that time. I did refill HCTZ and Lasix. The patient states that she's been taking these and has had improvement in her peripheral edema and blood pressure control. She is additionally seeing the medication management clinic who is helping facilitate her injury into a primary care clinic. She denies any chest pain shortness of breath or orthopnea. No exertional symptoms.. No fever or chills.     Past Medical History  Diagnosis Date  . Diabetes mellitus without complication (HCC)   . Hypertension   . Hypercholesteremia      Patient Active Problem List   Diagnosis Date Noted  . Healthcare-associated pneumonia 09/24/2015  . IDDM (insulin dependent diabetes mellitus) (HCC) 02/09/2013     Past Surgical History  Procedure Laterality Date  . Refractive surgery       Current Outpatient Rx  Name  Route  Sig  Dispense  Refill  . albuterol (PROVENTIL HFA;VENTOLIN HFA) 108 (90 BASE) MCG/ACT inhaler   Inhalation   Inhale 2 puffs into the lungs every 6 (six) hours as needed for wheezing or shortness of breath.   1 Inhaler   2   . furosemide (LASIX) 20 MG tablet   Oral   Take 1 tablet (20 mg total) by mouth daily.   30 tablet   2   . hydrochlorothiazide (HYDRODIURIL) 25 MG tablet   Oral   Take 1 tablet (25 mg total) by mouth daily.   30 tablet   2   . insulin NPH-regular Human (NOVOLIN 70/30) (70-30) 100 UNIT/ML injection    Subcutaneous   Inject 90 Units into the skin 2 (two) times daily with a meal.         . lisinopril (PRINIVIL,ZESTRIL) 20 MG tablet   Oral   Take 1 tablet (20 mg total) by mouth daily.   30 tablet   2   . metFORMIN (GLUCOPHAGE) 500 MG tablet   Oral   Take 1 tablet (500 mg total) by mouth 2 (two) times daily with a meal.   60 tablet   2   . metoprolol (LOPRESSOR) 50 MG tablet   Oral   Take 1 tablet (50 mg total) by mouth 2 (two) times daily.   60 tablet   0   . simvastatin (ZOCOR) 20 MG tablet   Oral   Take 1 tablet (20 mg total) by mouth daily.   30 tablet   0   . cephALEXin (KEFLEX) 500 MG capsule   Oral   Take 1 capsule (500 mg total) by mouth 3 (three) times daily.   21 capsule   0   . glipiZIDE (GLUCOTROL) 5 MG tablet   Oral   Take 0.5 tablets (2.5 mg total) by mouth 2 (two) times daily before a meal.   60 tablet   0   . hydrALAZINE (APRESOLINE) 100 MG tablet   Oral   Take 1 tablet (100 mg total) by mouth 3 (three) times daily.   90  tablet   0   . levofloxacin (LEVAQUIN) 250 MG tablet   Oral   Take 3 tablets (750 mg total) by mouth daily.   6 tablet   0   . predniSONE (DELTASONE) 50 MG tablet   Oral   Take 1 tablet (50 mg total) by mouth daily with breakfast.   4 tablet   0      Allergies Review of patient's allergies indicates no known allergies.   No family history on file.  Social History Social History  Substance Use Topics  . Smoking status: Current Every Day Smoker -- 0.50 packs/day    Types: Cigarettes  . Smokeless tobacco: None  . Alcohol Use: No    Review of Systems  Constitutional:   No fever or chills. No weight changes Eyes:   No blurry vision or double vision. Small area of erythema and crusting low the right eye on the maxilla ENT:   No sore throat.  Cardiovascular:   No chest pain. Respiratory:   No dyspnea or cough. Gastrointestinal:   Negative for abdominal pain, vomiting and diarrhea.  No BRBPR or  melena. Genitourinary:   Negative for dysuria or difficulty urinating. Musculoskeletal:   Negative for back pain. Chronic pitting edema bilateral lower extremities.  Skin:   Positive red painful area on the right shin Neurological:   Negative for headaches, focal weakness or numbness. Psychiatric:  No anxiety or depression.   Endocrine:  No changes in energy or sleep difficulty.  10-point ROS otherwise negative.  ____________________________________________   PHYSICAL EXAM:  VITAL SIGNS: ED Triage Vitals  Enc Vitals Group     BP 11/19/15 1153 160/91 mmHg     Pulse Rate 11/19/15 1153 72     Resp 11/19/15 1153 18     Temp 11/19/15 1153 97.7 F (36.5 C)     Temp Source 11/19/15 1153 Oral     SpO2 11/19/15 1153 98 %     Weight 11/19/15 1153 375 lb (170.099 kg)     Height 11/19/15 1153 6' (1.829 m)     Head Cir --      Peak Flow --      Pain Score 11/19/15 1155 8     Pain Loc --      Pain Edu? --      Excl. in GC? --     Vital signs reviewed, nursing assessments reviewed.   Constitutional:   Alert and oriented. Well appearing and in no distress. Eyes:   No scleral icterus. No conjunctival pallor. PERRL. EOMI ENT   Head:   Normocephalic and atraumatic. The right maxilla does show a small area with a slight amount of crusting and erythema approximately 2 mm x 5 mm. Nontender, no swelling. No drainage. Orbit is unaffected. No evidence of orbital or preseptal cellulitis   Nose:   No congestion/rhinnorhea. No septal hematoma   Mouth/Throat:   MMM, no pharyngeal erythema. No peritonsillar mass.    Neck:   No stridor. No SubQ emphysema. No meningismus. Hematological/Lymphatic/Immunilogical:   No cervical lymphadenopathy. Cardiovascular:   RRR. Symmetric bilateral radial and DP pulses.  No murmurs.  Respiratory:   Normal respiratory effort without tachypnea nor retractions. Breath sounds are clear and equal bilaterally. No wheezes/rales/rhonchi. Gastrointestinal:    Soft and nontender. Non distended. There is no CVA tenderness.  No rebound, rigidity, or guarding. Genitourinary:   deferred Musculoskeletal:   2+ pitting edema bilateral lower extremities, symmetric. On the anterior right shin area there is an  area approximately 5-6 cm in diameter of erythema and tenderness. There are also a few areas of skin breakdown within this region where there is some fibrinous exudate but no purulent drainage. There is no fluctuance or crepitus. Compartments are soft. Neurologic:   Normal speech and language.  CN 2-10 normal. Motor grossly intact. No gross focal neurologic deficits are appreciated.  Skin:    Skin is warm, dry and intact. No rash noted.  No petechiae, purpura, or bullae. Psychiatric:   Mood and affect are normal.____________________________________________    LABS (pertinent positives/negatives) (all labs ordered are listed, but only abnormal results are displayed) Labs Reviewed  CBC WITH DIFFERENTIAL/PLATELET - Abnormal; Notable for the following:    WBC 12.1 (*)    Neutro Abs 7.8 (*)    All other components within normal limits  COMPREHENSIVE METABOLIC PANEL - Abnormal; Notable for the following:    Sodium 133 (*)    Chloride 100 (*)    Glucose, Bld 411 (*)    BUN 26 (*)    Creatinine, Ser 1.78 (*)    Calcium 8.3 (*)    Total Protein 5.3 (*)    Albumin 1.8 (*)    GFR calc non Af Amer 35 (*)    GFR calc Af Amer 41 (*)    All other components within normal limits   ____________________________________________   EKG  Interpreted by me Normal sinus rhythm rate of 76, right axis, normal intervals. Normal QRS and ST segments. T wave inversions in 3 and aVF, unchanged from 11/06/2015  ____________________________________________    RADIOLOGY  X-ray right tib-fib unremarkable  ____________________________________________   PROCEDURES   ____________________________________________   INITIAL IMPRESSION / ASSESSMENT AND PLAN / ED  COURSE  Pertinent labs & imaging results that were available during my care of the patient were reviewed by me and considered in my medical decision making (see chart for details).  Patient presents with pain and redness of the right shin in the setting of chronic peripheral edema and diabetes. This appears to be a very minor cellulitis. There is no evidence of osteomyelitis or necrotizing fasciitis or abscess. Start the patient on oral antibiotics and she is tolerating oral intake and otherwise stable and well-appearing, follow-up with primary care.     ____________________________________________   FINAL CLINICAL IMPRESSION(S) / ED DIAGNOSES  Final diagnoses:  Cellulitis of right lower extremity  Peripheral edema      Sharman Cheek, MD 11/19/15 1758

## 2015-11-19 NOTE — Discharge Instructions (Signed)
Cellulitis °Cellulitis is an infection of the skin and the tissue beneath it. The infected area is usually red and tender. Cellulitis occurs most often in the arms and lower legs.  °CAUSES  °Cellulitis is caused by bacteria that enter the skin through cracks or cuts in the skin. The most common types of bacteria that cause cellulitis are staphylococci and streptococci. °SIGNS AND SYMPTOMS  °· Redness and warmth. °· Swelling. °· Tenderness or pain. °· Fever. °DIAGNOSIS  °Your health care provider can usually determine what is wrong based on a physical exam. Blood tests may also be done. °TREATMENT  °Treatment usually involves taking an antibiotic medicine. °HOME CARE INSTRUCTIONS  °· Take your antibiotic medicine as directed by your health care provider. Finish the antibiotic even if you start to feel better. °· Keep the infected arm or leg elevated to reduce swelling. °· Apply a warm cloth to the affected area up to 4 times per day to relieve pain. °· Take medicines only as directed by your health care provider. °· Keep all follow-up visits as directed by your health care provider. °SEEK MEDICAL CARE IF:  °· You notice red streaks coming from the infected area. °· Your red area gets larger or turns dark in color. °· Your bone or joint underneath the infected area becomes painful after the skin has healed. °· Your infection returns in the same area or another area. °· You notice a swollen bump in the infected area. °· You develop new symptoms. °· You have a fever. °SEEK IMMEDIATE MEDICAL CARE IF:  °· You feel very sleepy. °· You develop vomiting or diarrhea. °· You have a general ill feeling (malaise) with muscle aches and pains. °  °This information is not intended to replace advice given to you by your health care provider. Make sure you discuss any questions you have with your health care provider. °  °Document Released: 06/27/2005 Document Revised: 06/08/2015 Document Reviewed: 12/03/2011 °Elsevier Interactive  Patient Education ©2016 [employer] ° °Peripheral Edema °You have swelling in your legs (peripheral edema). This swelling is due to excess accumulation of salt and water in your body. Edema may be a sign of heart, kidney or liver disease, or a side effect of a medication. It may also be due to problems in the leg veins. Elevating your legs and using special support stockings may be very helpful, if the cause of the swelling is due to poor venous circulation. Avoid long periods of standing, whatever the cause. °Treatment of edema depends on identifying the cause. Chips, pretzels, pickles and other salty foods should be avoided. Restricting salt in your diet is almost always needed. Water pills (diuretics) are often used to remove the excess salt and water from your body via urine. These medicines prevent the kidney from reabsorbing sodium. This increases urine flow. °Diuretic treatment may also result in lowering of potassium levels in your body. Potassium supplements may be needed if you have to use diuretics daily. Daily weights can help you keep track of your progress in clearing your edema. You should call your caregiver for follow up care as recommended. °SEEK IMMEDIATE MEDICAL CARE IF:  °· You have increased swelling, pain, redness, or heat in your legs. °· You develop shortness of breath, especially when lying down. °· You develop chest or abdominal pain, weakness, or fainting. °· You have a fever. °  °This information is not intended to replace advice given to you by your health care provider. Make sure you discuss   any questions you have with your health care provider. °  °Document Released: 10/25/2004 Document Revised: 12/10/2011 Document Reviewed: 03/30/2015 °Elsevier Interactive Patient Education ©2016 [employer]. ° °

## 2015-12-06 ENCOUNTER — Ambulatory Visit: Payer: Self-pay

## 2015-12-23 ENCOUNTER — Inpatient Hospital Stay
Admission: EM | Admit: 2015-12-23 | Discharge: 2015-12-27 | DRG: 253 | Disposition: A | Discharge status: Home / Self Care | Payer: Medicaid Other | Attending: Internal Medicine | Admitting: Internal Medicine

## 2015-12-23 ENCOUNTER — Encounter: Payer: Self-pay | Admitting: Emergency Medicine

## 2015-12-23 ENCOUNTER — Emergency Department: Payer: Medicaid Other

## 2015-12-23 DIAGNOSIS — Z91120 Patient's intentional underdosing of medication regimen due to financial hardship: Secondary | ICD-10-CM | POA: Diagnosis not present

## 2015-12-23 DIAGNOSIS — E1100 Type 2 diabetes mellitus with hyperosmolarity without nonketotic hyperglycemic-hyperosmolar coma (NKHHC): Secondary | ICD-10-CM | POA: Diagnosis present

## 2015-12-23 DIAGNOSIS — R609 Edema, unspecified: Secondary | ICD-10-CM

## 2015-12-23 DIAGNOSIS — E1122 Type 2 diabetes mellitus with diabetic chronic kidney disease: Secondary | ICD-10-CM | POA: Diagnosis present

## 2015-12-23 DIAGNOSIS — E1152 Type 2 diabetes mellitus with diabetic peripheral angiopathy with gangrene: Principal | ICD-10-CM | POA: Diagnosis present

## 2015-12-23 DIAGNOSIS — N183 Chronic kidney disease, stage 3 (moderate): Secondary | ICD-10-CM | POA: Diagnosis present

## 2015-12-23 DIAGNOSIS — E1140 Type 2 diabetes mellitus with diabetic neuropathy, unspecified: Secondary | ICD-10-CM | POA: Diagnosis present

## 2015-12-23 DIAGNOSIS — I743 Embolism and thrombosis of arteries of the lower extremities: Secondary | ICD-10-CM | POA: Diagnosis present

## 2015-12-23 DIAGNOSIS — I878 Other specified disorders of veins: Secondary | ICD-10-CM | POA: Diagnosis present

## 2015-12-23 DIAGNOSIS — Z823 Family history of stroke: Secondary | ICD-10-CM | POA: Diagnosis not present

## 2015-12-23 DIAGNOSIS — F17210 Nicotine dependence, cigarettes, uncomplicated: Secondary | ICD-10-CM | POA: Diagnosis present

## 2015-12-23 DIAGNOSIS — E876 Hypokalemia: Secondary | ICD-10-CM | POA: Diagnosis present

## 2015-12-23 DIAGNOSIS — Z6841 Body Mass Index (BMI) 40.0 and over, adult: Secondary | ICD-10-CM

## 2015-12-23 DIAGNOSIS — L03115 Cellulitis of right lower limb: Secondary | ICD-10-CM | POA: Diagnosis present

## 2015-12-23 DIAGNOSIS — E7800 Pure hypercholesterolemia, unspecified: Secondary | ICD-10-CM | POA: Diagnosis present

## 2015-12-23 DIAGNOSIS — E11649 Type 2 diabetes mellitus with hypoglycemia without coma: Secondary | ICD-10-CM | POA: Diagnosis present

## 2015-12-23 DIAGNOSIS — L97509 Non-pressure chronic ulcer of other part of unspecified foot with unspecified severity: Secondary | ICD-10-CM | POA: Diagnosis present

## 2015-12-23 DIAGNOSIS — Z716 Tobacco abuse counseling: Secondary | ICD-10-CM

## 2015-12-23 DIAGNOSIS — Z833 Family history of diabetes mellitus: Secondary | ICD-10-CM

## 2015-12-23 DIAGNOSIS — I129 Hypertensive chronic kidney disease with stage 1 through stage 4 chronic kidney disease, or unspecified chronic kidney disease: Secondary | ICD-10-CM | POA: Diagnosis present

## 2015-12-23 DIAGNOSIS — T383X6A Underdosing of insulin and oral hypoglycemic [antidiabetic] drugs, initial encounter: Secondary | ICD-10-CM | POA: Diagnosis present

## 2015-12-23 DIAGNOSIS — I998 Other disorder of circulatory system: Secondary | ICD-10-CM

## 2015-12-23 DIAGNOSIS — R739 Hyperglycemia, unspecified: Secondary | ICD-10-CM

## 2015-12-23 DIAGNOSIS — Z8249 Family history of ischemic heart disease and other diseases of the circulatory system: Secondary | ICD-10-CM

## 2015-12-23 DIAGNOSIS — Z800 Family history of malignant neoplasm of digestive organs: Secondary | ICD-10-CM

## 2015-12-23 DIAGNOSIS — Z452 Encounter for adjustment and management of vascular access device: Secondary | ICD-10-CM

## 2015-12-23 DIAGNOSIS — E11621 Type 2 diabetes mellitus with foot ulcer: Secondary | ICD-10-CM | POA: Diagnosis present

## 2015-12-23 DIAGNOSIS — E1165 Type 2 diabetes mellitus with hyperglycemia: Secondary | ICD-10-CM | POA: Diagnosis present

## 2015-12-23 DIAGNOSIS — E871 Hypo-osmolality and hyponatremia: Secondary | ICD-10-CM

## 2015-12-23 LAB — URINALYSIS COMPLETE WITH MICROSCOPIC (ARMC ONLY)
Bilirubin Urine: NEGATIVE
Glucose, UA: >500 mg/dL — AB
Ketones, ur: NEGATIVE mg/dL
LEUKOCYTES UA: NEGATIVE
NITRITE: NEGATIVE
PH: 6 (ref 5.0–8.0)
Specific Gravity, Urine: 1.023 (ref 1.005–1.030)

## 2015-12-23 LAB — COMPREHENSIVE METABOLIC PANEL
ALBUMIN: 1.9 g/dL — AB (ref 3.5–5.0)
ALT: 28 U/L (ref 14–54)
ANION GAP: 9 (ref 5–15)
AST: 32 U/L (ref 15–41)
Alkaline Phosphatase: 105 U/L (ref 38–126)
BILIRUBIN TOTAL: 0.3 mg/dL (ref 0.3–1.2)
BUN: 19 mg/dL (ref 6–20)
CHLORIDE: 88 mmol/L — AB (ref 101–111)
CO2: 25 mmol/L (ref 22–32)
Calcium: 7.9 mg/dL — ABNORMAL LOW (ref 8.9–10.3)
Creatinine, Ser: 1.84 mg/dL — ABNORMAL HIGH (ref 0.44–1.00)
GFR calc Af Amer: 39 mL/min — ABNORMAL LOW (ref >60)
GFR, EST NON AFRICAN AMERICAN: 34 mL/min — AB (ref >60)
GLUCOSE: 806 mg/dL — AB (ref 65–99)
POTASSIUM: 2.9 mmol/L — AB (ref 3.5–5.1)
Sodium: 122 mmol/L — ABNORMAL LOW (ref 135–145)
TOTAL PROTEIN: 5.5 g/dL — AB (ref 6.5–8.1)

## 2015-12-23 LAB — TROPONIN I

## 2015-12-23 LAB — CBC WITH DIFFERENTIAL/PLATELET
BASOS PCT: 1 %
Basophils Absolute: 0.1 10*3/uL (ref 0–0.1)
EOS PCT: 3 %
Eosinophils Absolute: 0.2 10*3/uL (ref 0–0.7)
HEMATOCRIT: 48.6 % — AB (ref 35.0–47.0)
Hemoglobin: 15.9 g/dL (ref 12.0–16.0)
Lymphocytes Relative: 27 %
Lymphs Abs: 2.2 10*3/uL (ref 1.0–3.6)
MCH: 30 pg (ref 26.0–34.0)
MCHC: 32.7 g/dL (ref 32.0–36.0)
MCV: 91.7 fL (ref 80.0–100.0)
MONO ABS: 0.5 10*3/uL (ref 0.2–0.9)
MONOS PCT: 6 %
NEUTROS ABS: 5.2 10*3/uL (ref 1.4–6.5)
Neutrophils Relative %: 63 %
PLATELETS: 209 10*3/uL (ref 150–440)
RBC: 5.3 MIL/uL — ABNORMAL HIGH (ref 3.80–5.20)
RDW: 13.4 % (ref 11.5–14.5)
WBC: 8.2 10*3/uL (ref 3.6–11.0)

## 2015-12-23 LAB — MRSA PCR SCREENING: MRSA BY PCR: NEGATIVE

## 2015-12-23 LAB — GLUCOSE, CAPILLARY
Glucose-Capillary: >600 mg/dL (ref 65–99)
Glucose-Capillary: 577 mg/dL (ref 65–99)
Glucose-Capillary: >600 mg/dL (ref 65–99)

## 2015-12-23 LAB — APTT: APTT: 24 s (ref 24–36)

## 2015-12-23 MED ORDER — ACETAMINOPHEN 325 MG PO TABS
650.0000 mg | ORAL_TABLET | Freq: Four times a day (QID) | ORAL | Status: DC | PRN
  Administered 2015-12-24: 650 mg via ORAL
  Filled 2015-12-23: qty 2

## 2015-12-23 MED ORDER — DIPHENHYDRAMINE HCL 25 MG PO CAPS
100.0000 mg | ORAL_CAPSULE | Freq: Every evening | ORAL | Status: DC | PRN
  Administered 2015-12-26: 100 mg via ORAL
  Filled 2015-12-23: qty 4

## 2015-12-23 MED ORDER — ONDANSETRON HCL 4 MG PO TABS: 4.0000 mg | ORAL_TABLET | Freq: Four times a day (QID) | ORAL | Status: DC | PRN

## 2015-12-23 MED ORDER — MORPHINE SULFATE (PF) 4 MG/ML IV SOLN
4.0000 mg | INTRAVENOUS | Status: DC | PRN
Start: 2015-12-23 — End: 2015-12-24
  Administered 2015-12-23: 4 mg via INTRAVENOUS
  Filled 2015-12-23: qty 1

## 2015-12-23 MED ORDER — POTASSIUM CHLORIDE IN NACL 40-0.9 MEQ/L-% IV SOLN
INTRAVENOUS | Status: DC
  Administered 2015-12-23 – 2015-12-25 (×3): 100 mL/h via INTRAVENOUS
  Filled 2015-12-23 (×6): qty 1000

## 2015-12-23 MED ORDER — ALBUTEROL SULFATE (2.5 MG/3ML) 0.083% IN NEBU: 2.5000 mg | INHALATION_SOLUTION | RESPIRATORY_TRACT | Status: DC | PRN

## 2015-12-23 MED ORDER — ACETAMINOPHEN 650 MG RE SUPP: 650.0000 mg | Freq: Four times a day (QID) | RECTAL | Status: DC | PRN

## 2015-12-23 MED ORDER — MORPHINE SULFATE (PF) 4 MG/ML IV SOLN
INTRAVENOUS | Status: AC
  Administered 2015-12-23: 4 mg via INTRAVENOUS
  Filled 2015-12-23: qty 1

## 2015-12-23 MED ORDER — HYDRALAZINE HCL 20 MG/ML IJ SOLN: 10.0000 mg | Freq: Four times a day (QID) | INTRAMUSCULAR | Status: DC | PRN

## 2015-12-23 MED ORDER — SENNOSIDES-DOCUSATE SODIUM 8.6-50 MG PO TABS: 1.0000 | ORAL_TABLET | Freq: Every evening | ORAL | Status: DC | PRN

## 2015-12-23 MED ORDER — HEPARIN (PORCINE) IN NACL 100-0.45 UNIT/ML-% IJ SOLN
2050.0000 [IU]/h | Freq: Once | INTRAMUSCULAR | Status: AC
  Administered 2015-12-23: 2050 [IU]/h via INTRAVENOUS
  Filled 2015-12-23: qty 250

## 2015-12-23 MED ORDER — HYDROCODONE-ACETAMINOPHEN 5-325 MG PO TABS
1.0000 | ORAL_TABLET | ORAL | Status: DC | PRN
  Administered 2015-12-24 – 2015-12-27 (×5): 2 via ORAL
  Filled 2015-12-23 (×5): qty 2

## 2015-12-23 MED ORDER — ONDANSETRON HCL 4 MG/2ML IJ SOLN
INTRAMUSCULAR | Status: AC
  Administered 2015-12-23: 4 mg via INTRAVENOUS
  Filled 2015-12-23: qty 2

## 2015-12-23 MED ORDER — HEPARIN SODIUM (PORCINE) 5000 UNIT/ML IJ SOLN
4000.0000 [IU] | Freq: Once | INTRAMUSCULAR | Status: AC
  Administered 2015-12-23: 4000 [IU] via INTRAVENOUS
  Filled 2015-12-23: qty 1

## 2015-12-23 MED ORDER — METOPROLOL TARTRATE 50 MG PO TABS
50.0000 mg | ORAL_TABLET | Freq: Two times a day (BID) | ORAL | Status: DC
  Administered 2015-12-23 – 2015-12-27 (×8): 50 mg via ORAL
  Filled 2015-12-23 (×8): qty 1

## 2015-12-23 MED ORDER — SIMVASTATIN 20 MG PO TABS
20.0000 mg | ORAL_TABLET | Freq: Every day | ORAL | Status: DC
  Administered 2015-12-23 – 2015-12-26 (×4): 20 mg via ORAL
  Filled 2015-12-23 (×4): qty 1

## 2015-12-23 MED ORDER — POTASSIUM CHLORIDE CRYS ER 20 MEQ PO TBCR
40.0000 meq | EXTENDED_RELEASE_TABLET | Freq: Once | ORAL | Status: AC
  Administered 2015-12-23: 40 meq via ORAL
  Filled 2015-12-23: qty 2

## 2015-12-23 MED ORDER — ONDANSETRON HCL 4 MG/2ML IJ SOLN
4.0000 mg | Freq: Four times a day (QID) | INTRAMUSCULAR | Status: DC | PRN
  Administered 2015-12-23: 4 mg via INTRAVENOUS
  Filled 2015-12-23: qty 2

## 2015-12-23 MED ORDER — SODIUM CHLORIDE 0.9 % IV SOLN
INTRAVENOUS | Status: DC
  Administered 2015-12-23: 5.4 [IU]/h via INTRAVENOUS
  Filled 2015-12-23: qty 2.5

## 2015-12-23 MED ORDER — LISINOPRIL 20 MG PO TABS
20.0000 mg | ORAL_TABLET | Freq: Every day | ORAL | Status: DC
  Administered 2015-12-24 – 2015-12-25 (×2): 20 mg via ORAL
  Filled 2015-12-23 (×2): qty 1

## 2015-12-23 NOTE — ED Notes (Addendum)
Having pain to right foot  4 th toe is black  unsure of injury  Noticed some bruising to bottowe of same toe..but this am became worse  Swelling noted to both feet

## 2015-12-23 NOTE — ED Notes (Signed)
MD at bedside. 

## 2015-12-23 NOTE — Progress Notes (Signed)
ANTICOAGULATION CONSULT NOTE - Initial Consult  Pharmacy Consult for Heparin  Indication: DVT, ischemic leg   No Known Allergies  Patient Measurements: Height: 6' (182.9 cm) Weight: (!) 375 lb (170.099 kg) IBW/kg (Calculated) : 73.1 Heparin Dosing Weight: 115 kg   Vital Signs: Temp: 98.2 F (36.8 C) (03/24 1713) Temp Source: Oral (03/24 1713) BP: 188/82 mmHg (03/24 1830) Pulse Rate: 81 (03/24 1713)  Labs:  Recent Labs  12/23/15 1722  HGB 15.9  HCT 48.6*  PLT 209  APTT 24  CREATININE 1.84*  TROPONINI <0.03    Estimated Creatinine Clearance: 73.2 mL/min (by C-G formula based on Cr of 1.84).   Medical History: Past Medical History  Diagnosis Date  . Diabetes mellitus without complication (HCC)   . Hypertension   . Hypercholesteremia     Medications:   (Not in a hospital admission)  Assessment: Pharmacy consulted to dose heparin in this 40 year old female admitted with ischemic leg. No prior anticoag noted.  CrCl = 73.2 ml/min   Goal of Therapy:  Heparin level 0.3-0.7 units/ml Monitor platelets by anticoagulation protocol: Yes   Plan:  Heparin 4000 units IV X 1 given in ED on 3/24 @ 19:03. Heparin 2050 units/hr IV ordered to start on 3/24. Will draw 1st HL 6 hrs after start of drip on 3/25 @ 0230.   Bianca Hill D 12/23/2015,8:48 PM

## 2015-12-23 NOTE — ED Provider Notes (Signed)
[facility] Emergency Department Provider Note     Time seen: ----------------------------------------- 6:44 PM on 12/23/2015 -----------------------------------------    I have reviewed the triage vital signs and the nursing notes.   HISTORY  Chief Complaint Foot Pain    HPI Bianca Hill is a 40 y.o. female presents to ER for some pain to the right foot. Right fourth digit was noted to be discolored. Patient states she has not had normal feeling in the right foot for some time. Patient states she's run out of her 70/30 Novolinfor about 2 weeks due to financial reasons and his only been taking metformin. She denies fevers, chills, chest pain, shortness of breath or other complaints at this time.   Past Medical History  Diagnosis Date  . Diabetes mellitus without complication (HCC)   . Hypertension   . Hypercholesteremia     Patient Active Problem List   Diagnosis Date Noted  . Healthcare-associated pneumonia 09/24/2015  . IDDM (insulin dependent diabetes mellitus) (HCC) 02/09/2013    Past Surgical History  Procedure Laterality Date  . Refractive surgery      Allergies Review of patient's allergies indicates no known allergies.  Social History Social History  Substance Use Topics  . Smoking status: Current Every Day Smoker -- 0.50 packs/day    Types: Cigarettes  . Smokeless tobacco: None  . Alcohol Use: No    Review of Systems Constitutional: Negative for fever. Eyes: Negative for visual changes. ENT: Negative for sore throat. Cardiovascular: Negative for chest pain. Respiratory: Negative for shortness of breath. Gastrointestinal: Negative for abdominal pain, vomiting and diarrhea. Genitourinary: Negative for dysuria. Musculoskeletal: Positive for right foot pain Skin: Positive for discoloration of the right foot Neurological: Negative for headaches, focal weakness or numbness.  10-point ROS otherwise  negative.  ____________________________________________   PHYSICAL EXAM:  VITAL SIGNS: ED Triage Vitals  Enc Vitals Group     BP 12/23/15 1713 149/95 mmHg     Pulse Rate 12/23/15 1712 81     Resp 12/23/15 1712 18     Temp 12/23/15 1712 98.2 F (36.8 C)     Temp Source 12/23/15 1712 Oral     SpO2 12/23/15 1712 100 %     Weight 12/23/15 1712 375 lb (170.099 kg)     Height 12/23/15 1712 6' (1.829 m)     Head Cir --      Peak Flow --      Pain Score 12/23/15 1714 7     Pain Loc --      Pain Edu? --      Excl. in GC? --    Constitutional: Alert and oriented. Well appearing and in no distress. Eyes: Conjunctivae are normal. PERRL. Normal extraocular movements. ENT   Head: Normocephalic and atraumatic.   Nose: No congestion/rhinnorhea.   Mouth/Throat: Mucous membranes are moist.   Neck: No stridor. Cardiovascular: Normal rate, regular rhythm. Doppler pulses are present in the left foot, no dopplerable pulse in the dorsalis pedis on the right, posterior tibial pulses present, popliteal pulses present in the right leg. No murmurs, rubs, or gallops. Respiratory: Normal respiratory effort without tachypnea nor retractions. Breath sounds are clear and equal bilaterally. No wheezes/rales/rhonchi. Gastrointestinal: Soft and nontender. No distention. No abdominal bruits.  Musculoskeletal: Nontender, right foot is hyperemic compared to the left, the tip of the fourth digit in the right foot is black. She does have normal movement of the foot, no palpable pulses are present in the right foot. Neurologic:  Normal speech and language. No gross focal neurologic deficits are appreciated.  Skin:  Hyperemia is noted to the right foot, distal aspect of the right fourth digit is black Psychiatric: Mood and affect are normal. Speech and behavior are normal. Patient exhibits appropriate insight and judgment. ____________________________________________  EKG: Interpreted by me. Sinus rhythm  rate 83 bpm, normal PR interval, normal QRS, normal QT interval. Normal axis.  ____________________________________________  ED COURSE:  Pertinent labs & imaging results that were available during my care of the patient were reviewed by me and considered in my medical decision making (see chart for details). Patient presents with likely ischemic changes to the right foot and concerns for hyperglycemia. I'll check basic labs, and obtain x-rays ____________________________________________    LABS (pertinent positives/negatives)  Labs Reviewed  CBC WITH DIFFERENTIAL/PLATELET - Abnormal; Notable for the following:    RBC 5.30 (*)    HCT 48.6 (*)    All other components within normal limits  COMPREHENSIVE METABOLIC PANEL - Abnormal; Notable for the following:    Sodium 122 (*)    Potassium 2.9 (*)    Chloride 88 (*)    Glucose, Bld 806 (*)    Creatinine, Ser 1.84 (*)    Calcium 7.9 (*)    Total Protein 5.5 (*)    Albumin 1.9 (*)    GFR calc non Af Amer 34 (*)    GFR calc Af Amer 39 (*)    All other components within normal limits  URINALYSIS COMPLETEWITH MICROSCOPIC ([facility] ONLY)   CRITICAL CARE Performed by: Emily FilbertWilliams, Jonathan E   Total critical care time: 30 minutes  Critical care time was exclusive of separately billable procedures and treating other patients.  Critical care was necessary to treat or prevent imminent or life-threatening deterioration.  Critical care was time spent personally by me on the following activities: development of treatment plan with patient and/or surrogate as well as nursing, discussions with consultants, evaluation of patient's response to treatment, examination of patient, obtaining history from patient or surrogate, ordering and performing treatments and interventions, ordering and review of laboratory studies, ordering and review of radiographic studies, pulse oximetry and re-evaluation of patient's condition.  RADIOLOGY  Right foot  x-rays  IMPRESSION: Diffuse soft tissue swelling. No acute bony abnormalities. No findings to suggest osteomyelitis. ____________________________________________  FINAL ASSESSMENT AND PLAN  Hyperglycemia, pseudo-hyponatremia, chronic kidney disease, right foot ischemia  Plan: Patient with labs and imaging as dictated above. Patient is in no acute distress, not complaining of significant pain. Patient does present with profound hyperglycemia and should be placed on an insulin drip, also given her saline bolus. I have consulted vascular surgery and arteriogram will be ordered tomorrow. She will also be placed on heparin for the time being. Vascular surgeon does not feel like she needs emergent intervention in the right foot.   Emily FilbertWilliams, Jonathan E, MD   Emily FilbertJonathan E Williams, MD 12/23/15 [phone]

## 2015-12-23 NOTE — ED Notes (Signed)
Call has been made to floor by charge nurse to find additional channels for pumps.

## 2015-12-23 NOTE — H&P (Signed)
[facility] [place] [facility]   PATIENT NAME: Bianca GardenerSusan Zehnder    MR#:  [id number]  DATE OF BIRTH:  [birth year]  DATE OF ADMISSION:  12/23/2015  PRIMARY CARE PHYSICIAN: No PCP Per Patient   REQUESTING/REFERRING PHYSICIAN: Emily FilbertJonathan E Williams, MD  CHIEF COMPLAINT:   Chief Complaint  Patient presents with  . Foot Pain  Right foot pain for 1 week  HISTORY OF PRESENT ILLNESS:  Bianca GardenerSusan Slawson  is a 40 y.o. female with a known history of Hypertension, diabetes and hypercholesteremia. The patient has had right foot pain for the past one week. She noticed right fourth toe becoming blue. She has not had a normal feeling in the right foot for some time. She run out of Novolin 70/30 for the past 2 weeks due to financial reasons. She denies any other symptoms. Dr. Mayford KnifeWilliams, ED physician discussed with on-call vascular surgeon, who did not feel like she needs emergent intervention at this time.  PAST MEDICAL HISTORY:   Past Medical History  Diagnosis Date  . Diabetes mellitus without complication (HCC)   . Hypertension   . Hypercholesteremia     PAST SURGICAL HISTORY:   Past Surgical History  Procedure Laterality Date  . Refractive surgery      SOCIAL HISTORY:   Social History  Substance Use Topics  . Smoking status: Current Every Day Smoker -- 0.50 packs/day    Types: Cigarettes  . Smokeless tobacco: Not on file  . Alcohol Use: No    FAMILY HISTORY:   Family History  Problem Relation Age of Onset  . Hypertension Mother   . Diabetes Mother   . Stroke Mother   . Hyperlipidemia Father   . Thyroid disease Father   . Hypertension Father   . Diabetes Father   . Esophageal cancer Father     DRUG ALLERGIES:  No Known Allergies  REVIEW OF SYSTEMS:  CONSTITUTIONAL: No fever, fatigue or weakness.  EYES: No blurred or double vision.  EARS, NOSE, AND THROAT: No tinnitus or ear pain.  RESPIRATORY: No cough, shortness of breath, wheezing or hemoptysis.   CARDIOVASCULAR: No chest pain, orthopnea, edema.  GASTROINTESTINAL: No nausea, vomiting, diarrhea or abdominal pain.  GENITOURINARY: No dysuria, hematuria.  ENDOCRINE: No polyuria, nocturia,  HEMATOLOGY: No anemia, easy bruising or bleeding SKIN: No rash or lesion. MUSCULOSKELETAL: Right foot pain. Chronic skin redness on right ankle and foot.   NEUROLOGIC: No tingling, numbness, weakness.  PSYCHIATRY: No anxiety or depression.   MEDICATIONS AT HOME:   Prior to Admission medications   Medication Sig Start Date End Date Taking? Authorizing Provider  clindamycin (CLEOCIN) 300 MG capsule Take 300 mg by mouth 2 (two) times daily. 12/14/15 12/24/15 Yes Historical Provider, MD  diphenhydrAMINE (BENADRYL) 50 MG capsule Take 100 mg by mouth at bedtime as needed for sleep.   Yes Historical Provider, MD  furosemide (LASIX) 20 MG tablet Take 1 tablet (20 mg total) by mouth daily. 11/06/15 11/05/16 Yes Sharman CheekPhillip Stafford, MD  hydrochlorothiazide (HYDRODIURIL) 25 MG tablet Take 1 tablet (25 mg total) by mouth daily. 11/06/15  Yes Sharman CheekPhillip Stafford, MD  insulin NPH-regular Human (NOVOLIN 70/30) (70-30) 100 UNIT/ML injection Inject 90 Units into the skin 2 (two) times daily with a meal.   Yes Historical Provider, MD  lisinopril (PRINIVIL,ZESTRIL) 20 MG tablet Take 1 tablet (20 mg total) by mouth daily. 11/06/15 11/05/16 Yes Sharman CheekPhillip Stafford, MD  metFORMIN (GLUCOPHAGE) 500 MG tablet Take 1 tablet (500 mg total) by mouth 2 (  two) times daily with a meal. 11/06/15  Yes Sharman Cheek, MD  metoprolol (LOPRESSOR) 50 MG tablet Take 1 tablet (50 mg total) by mouth 2 (two) times daily. 09/20/15 09/19/16 Yes Rebecka Apley, MD  simvastatin (ZOCOR) 20 MG tablet Take 20 mg by mouth at bedtime.   Yes Historical Provider, MD  albuterol (PROVENTIL HFA;VENTOLIN HFA) 108 (90 BASE) MCG/ACT inhaler Inhale 2 puffs into the lungs every 6 (six) hours as needed for wheezing or shortness of breath. Patient not taking: Reported on 12/23/2015  09/22/15   Darien Ramus, MD  cephALEXin (KEFLEX) 500 MG capsule Take 1 capsule (500 mg total) by mouth 3 (three) times daily. Patient not taking: Reported on 12/23/2015 11/19/15   Sharman Cheek, MD  glipiZIDE (GLUCOTROL) 5 MG tablet Take 0.5 tablets (2.5 mg total) by mouth 2 (two) times daily before a meal. Patient not taking: Reported on 12/23/2015 09/27/15   Milagros Loll, MD  hydrALAZINE (APRESOLINE) 100 MG tablet Take 1 tablet (100 mg total) by mouth 3 (three) times daily. Patient not taking: Reported on 12/23/2015 09/27/15   Milagros Loll, MD  levofloxacin (LEVAQUIN) 250 MG tablet Take 3 tablets (750 mg total) by mouth daily. Patient not taking: Reported on 12/23/2015 09/27/15   Milagros Loll, MD  predniSONE (DELTASONE) 50 MG tablet Take 1 tablet (50 mg total) by mouth daily with breakfast. Patient not taking: Reported on 12/23/2015 09/27/15   Milagros Loll, MD      VITAL SIGNS:  Blood pressure 188/82, pulse 81, temperature 98.2 F (36.8 C), temperature source Oral, resp. rate 18, height 6' (1.829 m), weight 170.099 kg (375 lb), last menstrual period 12/21/2015, SpO2 100 %.  PHYSICAL EXAMINATION:  GENERAL:  40 y.o.-year-old patient lying in the bed with no acute distress. Morbidly obese. EYES: Pupils equal, round, reactive to light and accommodation. No scleral icterus. Extraocular muscles intact.  HEENT: Head atraumatic, normocephalic. Oropharynx and nasopharynx clear.  NECK:  Supple, no jugular venous distention. No thyroid enlargement, no tenderness.  LUNGS: Normal breath sounds bilaterally, no wheezing, rales,rhonchi or crepitation. No use of accessory muscles of respiration.  CARDIOVASCULAR: S1, S2 normal. No murmurs, rubs, or gallops.  ABDOMEN: Soft, nontender, nondistended. Bowel sounds present. No organomegaly or mass.  EXTREMITIES: No pedal edema, cyanosis, or clubbing. Mild erythema on the right ankle and foot, right fourth toe necrosis without discharge. Tenderness on  palpation. Cold skin count post lower extremities. Unable to palpate pedal pulses. NEUROLOGIC: Cranial nerves II through XII are intact. Muscle strength 5/5 in all extremities. Sensation intact. Gait not checked.  PSYCHIATRIC: The patient is alert and oriented x 3.  SKIN: No obvious rash, lesion, or ulcer.   LABORATORY PANEL:   CBC  Recent Labs Lab 12/23/15 1722  WBC 8.2  HGB 15.9  HCT 48.6*  PLT 209   ------------------------------------------------------------------------------------------------------------------  Chemistries   Recent Labs Lab 12/23/15 1722  NA 122*  K 2.9*  CL 88*  CO2 25  GLUCOSE 806*  BUN 19  CREATININE 1.84*  CALCIUM 7.9*  AST 32  ALT 28  ALKPHOS 105  BILITOT 0.3   ------------------------------------------------------------------------------------------------------------------  Cardiac Enzymes  Recent Labs Lab 12/23/15 1722  TROPONINI <0.03   ------------------------------------------------------------------------------------------------------------------  RADIOLOGY:  Dg Foot Complete Right  12/23/2015  CLINICAL DATA:  Fourth digit is black and swollen today. Something dropped on the third digit the other day in the toe nail is coming off. History of diabetes. EXAM: RIGHT FOOT COMPLETE - 3+ VIEW COMPARISON:  05/30/2010 FINDINGS: Diffuse soft  tissue swelling over the right foot and ankle. No radiopaque soft tissue foreign bodies or soft tissue gas is identified. No evidence of acute fracture or dislocation. No focal bone lesion or bone destruction. No cortical loss. No changes to suggest osteomyelitis. Mild degenerative changes in the intertarsal joints. IMPRESSION: Diffuse soft tissue swelling. No acute bony abnormalities. No findings to suggest osteomyelitis. Electronically Signed   By: Burman Nieves M.D.   On: 12/23/2015 18:33    EKG:   Orders placed or performed during the hospital encounter of 12/23/15  . ED EKG  . ED EKG  . EKG  12-Lead  . EKG 12-Lead    IMPRESSION AND PLAN:   Diabetes hyperosmolar non-ketosic state Start insulin drip, follow-up ICU protocol. Old metformin.  Pseudohyponatremia. Possible due to hyperglycemia.  Right fourth toe necrosis. Continue heparin drip, follow-up vascular surgery and podiatry consult.  Hypokalemia. Give potassium supplement and follow-up BMP and magnesium level.  Hypertension. Continue lisinopril and Lopressor with IV hydralazine when necessary. Hold Lasix and HCTZ.  CKD stage III. Stable.  Tobacco abuse. Smoking cessation was counseled for 3 minutes. Given nicotine patch.  Morbid obesity.  All the records are reviewed and case discussed with ED provider. Management plans discussed with the patient, family and they are in agreement.  CODE STATUS: Full code  TOTAL Critical TIME TAKING CARE OF THIS PATIENT:  62 minutes.    Shaune Pollack M.D on 12/23/2015 at 9:08 PM  Between 7am to 6pm - Pager - [phone]  After 6pm go to [email or web address] - password [email or web address] [facility]  [facility]  Office  [phone]  CC: Primary care physician; No PCP Per Patient

## 2015-12-23 NOTE — Progress Notes (Signed)
ANTICOAGULATION CONSULT NOTE - Initial Consult  Pharmacy Consult for Heparin  Indication: DVT, ischemic leg   No Known Allergies  Patient Measurements: Height: 6' (182.9 cm) Weight: (!) 375 lb (170.099 kg) IBW/kg (Calculated) : 73.1 Heparin Dosing Weight: 115 kg   Vital Signs: Temp: 98.2 F (36.8 C) (03/24 1713) Temp Source: Oral (03/24 1713) BP: 188/82 mmHg (03/24 1830) Pulse Rate: 81 (03/24 1713)  Labs:  Recent Labs  12/23/15 1722  HGB 15.9  HCT 48.6*  PLT 209  CREATININE 1.84*    Estimated Creatinine Clearance: 73.2 mL/min (by C-G formula based on Cr of 1.84).   Medical History: Past Medical History  Diagnosis Date  . Diabetes mellitus without complication (HCC)   . Hypertension   . Hypercholesteremia     Medications:   (Not in a hospital admission)  Assessment: Pharmacy consulted to dose heparin in this 40 year old female admitted with ischemic leg. No prior anticoag noted.  CrCl = 73.2 ml/min   Goal of Therapy:  Heparin level 0.3-0.7 units/ml Monitor platelets by anticoagulation protocol: Yes   Plan:  Heparin 4000 units IV X 1 given in ED on 3/24 @ 19:03. Heparin 2050 units/hr IV ordered to start on 3/24. Will draw 1st HL 6 hrs after start of drip on 3/25 @   Olney Monier D 12/23/2015,7:07 PM

## 2015-12-24 ENCOUNTER — Inpatient Hospital Stay: Payer: Medicaid Other

## 2015-12-24 DIAGNOSIS — E1100 Type 2 diabetes mellitus with hyperosmolarity without nonketotic hyperglycemic-hyperosmolar coma (NKHHC): Secondary | ICD-10-CM

## 2015-12-24 LAB — CBC
HCT: 43.5 % (ref 35.0–47.0)
HEMOGLOBIN: 15 g/dL (ref 12.0–16.0)
MCH: 30 pg (ref 26.0–34.0)
MCHC: 34.4 g/dL (ref 32.0–36.0)
MCV: 87.2 fL (ref 80.0–100.0)
Platelets: 230 10*3/uL (ref 150–440)
RBC: 4.99 MIL/uL (ref 3.80–5.20)
RDW: 12.8 % (ref 11.5–14.5)
WBC: 9.3 10*3/uL (ref 3.6–11.0)

## 2015-12-24 LAB — GLUCOSE, CAPILLARY
GLUCOSE-CAPILLARY: 196 mg/dL — AB (ref 65–99)
GLUCOSE-CAPILLARY: 189 mg/dL — AB (ref 65–99)
GLUCOSE-CAPILLARY: 162 mg/dL — AB (ref 65–99)
GLUCOSE-CAPILLARY: 161 mg/dL — AB (ref 65–99)
GLUCOSE-CAPILLARY: 179 mg/dL — AB (ref 65–99)
GLUCOSE-CAPILLARY: 90 mg/dL (ref 65–99)
Glucose-Capillary: 132 mg/dL — ABNORMAL HIGH (ref 65–99)
Glucose-Capillary: 157 mg/dL — ABNORMAL HIGH (ref 65–99)
Glucose-Capillary: 159 mg/dL — ABNORMAL HIGH (ref 65–99)
Glucose-Capillary: 172 mg/dL — ABNORMAL HIGH (ref 65–99)
Glucose-Capillary: 220 mg/dL — ABNORMAL HIGH (ref 65–99)
Glucose-Capillary: 257 mg/dL — ABNORMAL HIGH (ref 65–99)
Glucose-Capillary: 353 mg/dL — ABNORMAL HIGH (ref 65–99)
Glucose-Capillary: 530 mg/dL — ABNORMAL HIGH (ref 65–99)

## 2015-12-24 LAB — BASIC METABOLIC PANEL
ANION GAP: 10 (ref 5–15)
Anion gap: 6 (ref 5–15)
BUN: 20 mg/dL (ref 6–20)
BUN: 22 mg/dL — AB (ref 6–20)
CALCIUM: 8.1 mg/dL — AB (ref 8.9–10.3)
CHLORIDE: 99 mmol/L — AB (ref 101–111)
CO2: 20 mmol/L — ABNORMAL LOW (ref 22–32)
CO2: 23 mmol/L (ref 22–32)
Calcium: 7.8 mg/dL — ABNORMAL LOW (ref 8.9–10.3)
Chloride: 98 mmol/L — ABNORMAL LOW (ref 101–111)
Creatinine, Ser: 1.84 mg/dL — ABNORMAL HIGH (ref 0.44–1.00)
Creatinine, Ser: 2.1 mg/dL — ABNORMAL HIGH (ref 0.44–1.00)
GFR calc Af Amer: 39 mL/min — ABNORMAL LOW (ref >60)
GFR calc Af Amer: 33 mL/min — ABNORMAL LOW (ref >60)
GFR, EST NON AFRICAN AMERICAN: 34 mL/min — AB (ref >60)
GFR, EST NON AFRICAN AMERICAN: 29 mL/min — AB (ref >60)
GLUCOSE: 294 mg/dL — AB (ref 65–99)
GLUCOSE: 164 mg/dL — AB (ref 65–99)
POTASSIUM: 3.8 mmol/L (ref 3.5–5.1)
Potassium: 2.7 mmol/L — CL (ref 3.5–5.1)
SODIUM: 128 mmol/L — AB (ref 135–145)
Sodium: 128 mmol/L — ABNORMAL LOW (ref 135–145)

## 2015-12-24 LAB — MAGNESIUM: Magnesium: 1.9 mg/dL (ref 1.7–2.4)

## 2015-12-24 LAB — HEPARIN LEVEL (UNFRACTIONATED): Heparin Unfractionated: 1.05 IU/mL — ABNORMAL HIGH (ref 0.30–0.70)

## 2015-12-24 MED ORDER — POTASSIUM CHLORIDE CRYS ER 20 MEQ PO TBCR
40.0000 meq | EXTENDED_RELEASE_TABLET | ORAL | Status: AC
  Administered 2015-12-24 (×2): 40 meq via ORAL
  Filled 2015-12-24 (×2): qty 2

## 2015-12-24 MED ORDER — HEPARIN BOLUS VIA INFUSION
3500.0000 [IU] | Freq: Once | INTRAVENOUS | Status: AC
  Administered 2015-12-24: 3500 [IU] via INTRAVENOUS
  Filled 2015-12-24: qty 3500

## 2015-12-24 MED ORDER — INSULIN ASPART 100 UNIT/ML ~~LOC~~ SOLN
0.0000 [IU] | Freq: Three times a day (TID) | SUBCUTANEOUS | Status: DC
  Administered 2015-12-24: 7 [IU] via SUBCUTANEOUS
  Administered 2015-12-25: 4 [IU] via SUBCUTANEOUS
  Administered 2015-12-25: 3 [IU] via SUBCUTANEOUS
  Administered 2015-12-26 (×2): 4 [IU] via SUBCUTANEOUS
  Administered 2015-12-27: 7 [IU] via SUBCUTANEOUS
  Administered 2015-12-27: 15 [IU] via SUBCUTANEOUS
  Filled 2015-12-24 (×2): qty 7
  Filled 2015-12-24: qty 3
  Filled 2015-12-24 (×3): qty 4
  Filled 2015-12-24: qty 15

## 2015-12-24 MED ORDER — HEPARIN (PORCINE) IN NACL 100-0.45 UNIT/ML-% IJ SOLN
2150.0000 [IU]/h | INTRAMUSCULAR | Status: DC
  Administered 2015-12-24: 2150 [IU]/h via INTRAVENOUS
  Administered 2015-12-24 (×2): 2500 [IU]/h via INTRAVENOUS
  Filled 2015-12-24 (×3): qty 250

## 2015-12-24 MED ORDER — MORPHINE SULFATE (PF) 2 MG/ML IV SOLN
2.0000 mg | INTRAVENOUS | Status: DC | PRN
  Administered 2015-12-25 – 2015-12-26 (×4): 2 mg via INTRAVENOUS
  Filled 2015-12-24 (×4): qty 1

## 2015-12-24 MED ORDER — INSULIN ASPART PROT & ASPART (70-30 MIX) 100 UNIT/ML ~~LOC~~ SUSP
60.0000 [IU] | Freq: Two times a day (BID) | SUBCUTANEOUS | Status: DC
  Administered 2015-12-24 – 2015-12-27 (×5): 60 [IU] via SUBCUTANEOUS
  Filled 2015-12-24 (×5): qty 60

## 2015-12-24 MED ORDER — INSULIN ASPART 100 UNIT/ML ~~LOC~~ SOLN
0.0000 [IU] | Freq: Every day | SUBCUTANEOUS | Status: DC
  Administered 2015-12-26: 3 [IU] via SUBCUTANEOUS
  Filled 2015-12-24: qty 3

## 2015-12-24 NOTE — Progress Notes (Signed)
ANTICOAGULATION CONSULT NOTE - Initial Consult  Pharmacy Consult for Heparin  Indication: DVT, ischemic leg   No Known Allergies  Patient Measurements: Height: 6' (182.9 cm) Weight: (!) 375 lb (170.099 kg) IBW/kg (Calculated) : 73.1 Heparin Dosing Weight: 115 kg   Vital Signs: Temp: 98.2 F (36.8 C) (03/24 1713) Temp Source: Oral (03/24 1713) BP: 173/70 mmHg (03/24 2130) Pulse Rate: 75 (03/24 2100)  Labs:  Recent Labs  12/23/15 1722 12/24/15 0146 12/24/15 0243  HGB 15.9 15.0  --   HCT 48.6* 43.5  --   PLT 209 230  --   APTT 24  --   --   HEPARINUNFRC  --   --  <0.10*  CREATININE 1.84* 1.84*  --   TROPONINI <0.03  --   --     Estimated Creatinine Clearance: 73.2 mL/min (by C-G formula based on Cr of 1.84).   Medical History: Past Medical History  Diagnosis Date  . Diabetes mellitus without complication (HCC)   . Hypertension   . Hypercholesteremia     Medications:  Prescriptions prior to admission  Medication Sig Dispense Refill Last Dose  . clindamycin (CLEOCIN) 300 MG capsule Take 300 mg by mouth 2 (two) times daily.   12/23/2015 at Unknown time  . diphenhydrAMINE (BENADRYL) 50 MG capsule Take 100 mg by mouth at bedtime as needed for sleep.   12/22/2015 at Unknown time  . furosemide (LASIX) 20 MG tablet Take 1 tablet (20 mg total) by mouth daily. 30 tablet 2 12/23/2015 at Unknown time  . hydrochlorothiazide (HYDRODIURIL) 25 MG tablet Take 1 tablet (25 mg total) by mouth daily. 30 tablet 2 12/23/2015 at Unknown time  . insulin NPH-regular Human (NOVOLIN 70/30) (70-30) 100 UNIT/ML injection Inject 90 Units into the skin 2 (two) times daily with a meal.   Past Month at Unknown time  . lisinopril (PRINIVIL,ZESTRIL) 20 MG tablet Take 1 tablet (20 mg total) by mouth daily. 30 tablet 2 12/23/2015 at Unknown time  . metFORMIN (GLUCOPHAGE) 500 MG tablet Take 1 tablet (500 mg total) by mouth 2 (two) times daily with a meal. 60 tablet 2 12/23/2015 at Unknown time  .  metoprolol (LOPRESSOR) 50 MG tablet Take 1 tablet (50 mg total) by mouth 2 (two) times daily. 60 tablet 0 12/23/2015 at 0900  . simvastatin (ZOCOR) 20 MG tablet Take 20 mg by mouth at bedtime.   12/22/2015 at Unknown time  . albuterol (PROVENTIL HFA;VENTOLIN HFA) 108 (90 BASE) MCG/ACT inhaler Inhale 2 puffs into the lungs every 6 (six) hours as needed for wheezing or shortness of breath. (Patient not taking: Reported on 12/23/2015) 1 Inhaler 2 11/18/2015 at Unknown time  . cephALEXin (KEFLEX) 500 MG capsule Take 1 capsule (500 mg total) by mouth 3 (three) times daily. (Patient not taking: Reported on 12/23/2015) 21 capsule 0   . glipiZIDE (GLUCOTROL) 5 MG tablet Take 0.5 tablets (2.5 mg total) by mouth 2 (two) times daily before a meal. (Patient not taking: Reported on 12/23/2015) 60 tablet 0   . hydrALAZINE (APRESOLINE) 100 MG tablet Take 1 tablet (100 mg total) by mouth 3 (three) times daily. (Patient not taking: Reported on 12/23/2015) 90 tablet 0   . levofloxacin (LEVAQUIN) 250 MG tablet Take 3 tablets (750 mg total) by mouth daily. (Patient not taking: Reported on 12/23/2015) 6 tablet 0   . predniSONE (DELTASONE) 50 MG tablet Take 1 tablet (50 mg total) by mouth daily with breakfast. (Patient not taking: Reported on 12/23/2015) 4 tablet 0  Assessment: Pharmacy consulted to dose heparin in this 40 year old female admitted with ischemic leg. No prior anticoag noted.  CrCl = 73.2 ml/min   Goal of Therapy:  Heparin level 0.3-0.7 units/ml Monitor platelets by anticoagulation protocol: Yes   Plan:  Heparin 4000 units IV X 1 given in ED on 3/24 @ 19:03. Heparin 2050 units/hr IV ordered to start on 3/24. Will draw 1st HL 6 hrs after start of drip on 3/25 @ 0230.   3/25 02:30 heparin level <0.1. 3500 unit bolus and increase rate to 2500 units/hr. Recheck in 6 hours.  Ketrina Boateng S 12/24/2015,4:20 AM

## 2015-12-24 NOTE — Progress Notes (Signed)
ANTICOAGULATION CONSULT NOTE - Initial Consult  Pharmacy Consult for Heparin  Indication: DVT, ischemic leg   No Known Allergies  Patient Measurements: Height: 6' (182.9 cm) Weight: (!) 375 lb (170.099 kg) IBW/kg (Calculated) : 73.1 Heparin Dosing Weight: 115 kg   Vital Signs: Temp: 97.8 F (36.6 C) (03/25 0800) Temp Source: Oral (03/25 0800) BP: 141/69 mmHg (03/25 1300) Pulse Rate: 71 (03/25 1300)  Labs:  Recent Labs  12/23/15 1722 12/24/15 0146 12/24/15 0243  HGB 15.9 15.0  --   HCT 48.6* 43.5  --   PLT 209 230  --   APTT 24  --   --   HEPARINUNFRC  --   --  <0.10*  CREATININE 1.84* 1.84*  --   TROPONINI <0.03  --   --     Estimated Creatinine Clearance: 73.2 mL/min (by C-G formula based on Cr of 1.84).   Medical History: Past Medical History  Diagnosis Date  . Diabetes mellitus without complication (HCC)   . Hypertension   . Hypercholesteremia     Medications:  Prescriptions prior to admission  Medication Sig Dispense Refill Last Dose  . clindamycin (CLEOCIN) 300 MG capsule Take 300 mg by mouth 2 (two) times daily.   12/23/2015 at Unknown time  . diphenhydrAMINE (BENADRYL) 50 MG capsule Take 100 mg by mouth at bedtime as needed for sleep.   12/22/2015 at Unknown time  . furosemide (LASIX) 20 MG tablet Take 1 tablet (20 mg total) by mouth daily. 30 tablet 2 12/23/2015 at Unknown time  . hydrochlorothiazide (HYDRODIURIL) 25 MG tablet Take 1 tablet (25 mg total) by mouth daily. 30 tablet 2 12/23/2015 at Unknown time  . insulin NPH-regular Human (NOVOLIN 70/30) (70-30) 100 UNIT/ML injection Inject 90 Units into the skin 2 (two) times daily with a meal.   Past Month at Unknown time  . lisinopril (PRINIVIL,ZESTRIL) 20 MG tablet Take 1 tablet (20 mg total) by mouth daily. 30 tablet 2 12/23/2015 at Unknown time  . metFORMIN (GLUCOPHAGE) 500 MG tablet Take 1 tablet (500 mg total) by mouth 2 (two) times daily with a meal. 60 tablet 2 12/23/2015 at Unknown time  .  metoprolol (LOPRESSOR) 50 MG tablet Take 1 tablet (50 mg total) by mouth 2 (two) times daily. 60 tablet 0 12/23/2015 at 0900  . simvastatin (ZOCOR) 20 MG tablet Take 20 mg by mouth at bedtime.   12/22/2015 at Unknown time  . albuterol (PROVENTIL HFA;VENTOLIN HFA) 108 (90 BASE) MCG/ACT inhaler Inhale 2 puffs into the lungs every 6 (six) hours as needed for wheezing or shortness of breath. (Patient not taking: Reported on 12/23/2015) 1 Inhaler 2 11/18/2015 at Unknown time  . cephALEXin (KEFLEX) 500 MG capsule Take 1 capsule (500 mg total) by mouth 3 (three) times daily. (Patient not taking: Reported on 12/23/2015) 21 capsule 0   . glipiZIDE (GLUCOTROL) 5 MG tablet Take 0.5 tablets (2.5 mg total) by mouth 2 (two) times daily before a meal. (Patient not taking: Reported on 12/23/2015) 60 tablet 0   . hydrALAZINE (APRESOLINE) 100 MG tablet Take 1 tablet (100 mg total) by mouth 3 (three) times daily. (Patient not taking: Reported on 12/23/2015) 90 tablet 0   . levofloxacin (LEVAQUIN) 250 MG tablet Take 3 tablets (750 mg total) by mouth daily. (Patient not taking: Reported on 12/23/2015) 6 tablet 0   . predniSONE (DELTASONE) 50 MG tablet Take 1 tablet (50 mg total) by mouth daily with breakfast. (Patient not taking: Reported on 12/23/2015) 4 tablet 0  Assessment: Pharmacy consulted to dose heparin in this 40 year old female admitted with ischemic leg. No prior anticoag noted.    Goal of Therapy:  Heparin level 0.3-0.7 units/ml Monitor platelets by anticoagulation protocol: Yes   Plan:  Phlebotomists unable to obtain blood sample for HL due to swelling. Line draw not possible as neither IV site has blood return. Dr. Elpidio AnisSudini to determine course of action after discussing with vascular. Plan is to continue heparin drip without adjustment for now.   Luisa Harthristy, Marisela Line D 12/24/2015,1:51 PM

## 2015-12-24 NOTE — Progress Notes (Signed)
ANTICOAGULATION CONSULT NOTE - Initial Consult  Pharmacy Consult for Heparin  Indication: DVT, ischemic leg   No Known Allergies  Patient Measurements: Height: 6' (182.9 cm) Weight: (!) 375 lb (170.099 kg) IBW/kg (Calculated) : 73.1 Heparin Dosing Weight: 115 kg   Vital Signs: Temp: 97.9 F (36.6 C) (03/25 1426) Temp Source: Oral (03/25 1426) BP: 133/76 mmHg (03/25 1426) Pulse Rate: 71 (03/25 1426)  Labs:  Recent Labs  12/23/15 1722 12/24/15 0146 12/24/15 0243 12/24/15 1928  HGB 15.9 15.0  --   --   HCT 48.6* 43.5  --   --   PLT 209 230  --   --   APTT 24  --   --   --   HEPARINUNFRC  --   --  <0.10* 1.05*  CREATININE 1.84* 1.84*  --  2.10*  TROPONINI <0.03  --   --   --     Estimated Creatinine Clearance: 64.2 mL/min (by C-G formula based on Cr of 2.1).   Medical History: Past Medical History  Diagnosis Date  . Diabetes mellitus without complication (HCC)   . Hypertension   . Hypercholesteremia     Medications:  Prescriptions prior to admission  Medication Sig Dispense Refill Last Dose  . clindamycin (CLEOCIN) 300 MG capsule Take 300 mg by mouth 2 (two) times daily.   12/23/2015 at Unknown time  . diphenhydrAMINE (BENADRYL) 50 MG capsule Take 100 mg by mouth at bedtime as needed for sleep.   12/22/2015 at Unknown time  . furosemide (LASIX) 20 MG tablet Take 1 tablet (20 mg total) by mouth daily. 30 tablet 2 12/23/2015 at Unknown time  . hydrochlorothiazide (HYDRODIURIL) 25 MG tablet Take 1 tablet (25 mg total) by mouth daily. 30 tablet 2 12/23/2015 at Unknown time  . insulin NPH-regular Human (NOVOLIN 70/30) (70-30) 100 UNIT/ML injection Inject 90 Units into the skin 2 (two) times daily with a meal.   Past Month at Unknown time  . lisinopril (PRINIVIL,ZESTRIL) 20 MG tablet Take 1 tablet (20 mg total) by mouth daily. 30 tablet 2 12/23/2015 at Unknown time  . metFORMIN (GLUCOPHAGE) 500 MG tablet Take 1 tablet (500 mg total) by mouth 2 (two) times daily with a meal.  60 tablet 2 12/23/2015 at Unknown time  . metoprolol (LOPRESSOR) 50 MG tablet Take 1 tablet (50 mg total) by mouth 2 (two) times daily. 60 tablet 0 12/23/2015 at 0900  . simvastatin (ZOCOR) 20 MG tablet Take 20 mg by mouth at bedtime.   12/22/2015 at Unknown time  . albuterol (PROVENTIL HFA;VENTOLIN HFA) 108 (90 BASE) MCG/ACT inhaler Inhale 2 puffs into the lungs every 6 (six) hours as needed for wheezing or shortness of breath. (Patient not taking: Reported on 12/23/2015) 1 Inhaler 2 11/18/2015 at Unknown time  . cephALEXin (KEFLEX) 500 MG capsule Take 1 capsule (500 mg total) by mouth 3 (three) times daily. (Patient not taking: Reported on 12/23/2015) 21 capsule 0   . glipiZIDE (GLUCOTROL) 5 MG tablet Take 0.5 tablets (2.5 mg total) by mouth 2 (two) times daily before a meal. (Patient not taking: Reported on 12/23/2015) 60 tablet 0   . hydrALAZINE (APRESOLINE) 100 MG tablet Take 1 tablet (100 mg total) by mouth 3 (three) times daily. (Patient not taking: Reported on 12/23/2015) 90 tablet 0   . levofloxacin (LEVAQUIN) 250 MG tablet Take 3 tablets (750 mg total) by mouth daily. (Patient not taking: Reported on 12/23/2015) 6 tablet 0   . predniSONE (DELTASONE) 50 MG tablet Take  1 tablet (50 mg total) by mouth daily with breakfast. (Patient not taking: Reported on 12/23/2015) 4 tablet 0     Assessment: Pharmacy consulted to dose heparin in this 40 year old female admitted with ischemic leg. No prior anticoag noted.    Pt had a PICC line placed due to access issues.  HL 3/25 @ 1928 = 1.05  Goal of Therapy:  Heparin level 0.3-0.7 units/ml Monitor platelets by anticoagulation protocol: Yes   Plan:  Will hold drip for 1 hour. Then resume drip at lower rate 2150 units/hr. Will recheck level 6 hours from when the drip is restarted. Spoke with nurse and asked her to hold drip for 1 hr.   Olene FlossMelissa D Kimiko Common, Pharm.D Clinical Pharmacist   12/24/2015,8:06 PM

## 2015-12-24 NOTE — Consult Note (Addendum)
Consult Note  Patient name: Bianca Hill MRN: [id number] DOB: 1975-12-06 Sex: female  Consulting Physician:  Dr.Sudini  Reason for Consult:  Chief Complaint  Patient presents with  . Foot Pain    HISTORY OF PRESENT ILLNESS: This is a 40 year old female who presented to the hospital last night with concerns over changes to her right foot.  She states that she has been dealing with cellulitis and infection for approximately 6 months.  She has recently noticed a portion of her Right fourth toe turning black.  She has been admitted to [facility] for cellulitis recently.  She states that because of a recent loss of employment, she has not been able to afford her medications and has not been checking her blood sugars.  In the emergency department she had a blood sugar of 800.  She was admitted to the ICU.  The patient suffers with hypertension.  Because of not taking her medications this is not well controlled nor is her hypercholesterolemia.  She is morbidly obese.  She has diabetic neuropathy in both feet.  She ambulates with a cane.  Past Medical History  Diagnosis Date  . Diabetes mellitus without complication (HCC)   . Hypertension   . Hypercholesteremia     Past Surgical History  Procedure Laterality Date  . Refractive surgery      Social History   Social History  . Marital Status: Single    Spouse Name: N/A  . Number of Children: N/A  . Years of Education: N/A   Occupational History  . Not on file.   Social History Main Topics  . Smoking status: Current Every Day Smoker -- 0.50 packs/day    Types: Cigarettes  . Smokeless tobacco: Not on file  . Alcohol Use: No  . Drug Use: Not on file  . Sexual Activity: Not on file   Other Topics Concern  . Not on file   Social History Narrative    Family History  Problem Relation Age of Onset  . Hypertension Mother   . Diabetes Mother   . Stroke Mother   . Hyperlipidemia Father   . Thyroid disease Father    . Hypertension Father   . Diabetes Father   . Esophageal cancer Father     Allergies as of 12/23/2015  . (No Known Allergies)    No current facility-administered medications on file prior to encounter.   Current Outpatient Prescriptions on File Prior to Encounter  Medication Sig Dispense Refill  . furosemide (LASIX) 20 MG tablet Take 1 tablet (20 mg total) by mouth daily. 30 tablet 2  . hydrochlorothiazide (HYDRODIURIL) 25 MG tablet Take 1 tablet (25 mg total) by mouth daily. 30 tablet 2  . insulin NPH-regular Human (NOVOLIN 70/30) (70-30) 100 UNIT/ML injection Inject 90 Units into the skin 2 (two) times daily with a meal.    . lisinopril (PRINIVIL,ZESTRIL) 20 MG tablet Take 1 tablet (20 mg total) by mouth daily. 30 tablet 2  . metFORMIN (GLUCOPHAGE) 500 MG tablet Take 1 tablet (500 mg total) by mouth 2 (two) times daily with a meal. 60 tablet 2  . metoprolol (LOPRESSOR) 50 MG tablet Take 1 tablet (50 mg total) by mouth 2 (two) times daily. 60 tablet 0  . albuterol (PROVENTIL HFA;VENTOLIN HFA) 108 (90 BASE) MCG/ACT inhaler Inhale 2 puffs into the lungs every 6 (six) hours as needed for wheezing or shortness of breath. (Patient not taking: Reported on 12/23/2015) 1  Inhaler 2  . cephALEXin (KEFLEX) 500 MG capsule Take 1 capsule (500 mg total) by mouth 3 (three) times daily. (Patient not taking: Reported on 12/23/2015) 21 capsule 0  . glipiZIDE (GLUCOTROL) 5 MG tablet Take 0.5 tablets (2.5 mg total) by mouth 2 (two) times daily before a meal. (Patient not taking: Reported on 12/23/2015) 60 tablet 0  . hydrALAZINE (APRESOLINE) 100 MG tablet Take 1 tablet (100 mg total) by mouth 3 (three) times daily. (Patient not taking: Reported on 12/23/2015) 90 tablet 0  . levofloxacin (LEVAQUIN) 250 MG tablet Take 3 tablets (750 mg total) by mouth daily. (Patient not taking: Reported on 12/23/2015) 6 tablet 0  . predniSONE (DELTASONE) 50 MG tablet Take 1 tablet (50 mg total) by mouth daily with breakfast.  (Patient not taking: Reported on 12/23/2015) 4 tablet 0     REVIEW OF SYSTEMS: CONSTITUTIONAL: No fever, fatigue or weakness.  EYES: No blurred or double vision.  EARS, NOSE, AND THROAT: No tinnitus or ear pain.  RESPIRATORY: No cough, shortness of breath, wheezing or hemoptysis.  CARDIOVASCULAR: No chest pain, orthopnea, edema.  GASTROINTESTINAL: No nausea, vomiting, diarrhea or abdominal pain.  GENITOURINARY: No dysuria, hematuria.  ENDOCRINE: No polyuria, nocturia,  HEMATOLOGY: No anemia, easy bruising or bleeding SKIN: No rash or lesion. MUSCULOSKELETAL: Right foot pain. Chronic skin redness on right ankle and foot.  NEUROLOGIC: No tingling, numbness, weakness.  PSYCHIATRY: No anxiety or depression.   PHYSICAL EXAMINATION: General: The patient appears their stated age.  Vital signs are BP 133/76 mmHg  Pulse 71  Temp(Src) 97.9 F (36.6 C) (Oral)  Resp 16  Ht 6' (1.829 m)  Wt 375 lb (170.099 kg)  BMI 50.85 kg/m2  SpO2 100%  LMP 12/21/2015 Pulmonary: Respirations are non-labored HEENT:  Poor dentition Abdomen: Soft and non-tender  Musculoskeletal: There are no major deformities.   Neurologic: Normal motor function to bilateral lower extremities.  She does have decreased sensation bilaterally Skin: Erythema to right foot.  Small ulcer on the tip of the right fourth toe Psychiatric: The patient has normal affect. Cardiovascular: There is a regular rate and rhythm without significant murmur appreciated.  I couldn't get a faint right monophasic posterior tibial signal.  There is 2+ pitting edema bilaterally with erythema tracking on the dorsum of the right foot.  Diagnostic Studies: Foot x-ray suggests no osteomyelitis   Assessment:  #1: Diabetic foot ulcer #2: Venous stasis #3 poorly controlled diabetes Plan: #1 There is no evidence to suggest that this is an acute process to her right leg.  I was unable to obtain adequate Doppler signals in her right foot.  I  discussed with the patient that she needs to undergo angiography to better define her anatomy and to possibly treat any stenosis or occlusion identified.  This will be planned for Monday.  She'll need to be nothing by mouth after midnight.  She understands that this is a limb threatening situation.  #2 The patient has significant edema with cellulitis on the right foot likely secondary to lymphedema versus venous stasis.  We discussed keeping her legs elevated.  I think she would benefit from compression stockings once she is discharged.  She will need to be on IV antibiotics until his cellulitis improves.  #3: I discussed the importance of monitoring her blood sugar and keeping her diabetes under control.  If she does not, this will be a recurring problem for the patient and will likely end with limb loss.  She will need social work  to help facilitate getting her medications at the time of discharge.     Jorge Ny, M.D. [facility] Office: [phone] Pager:  [phone]

## 2015-12-24 NOTE — Consult Note (Signed)
Patient Demographics  Manuel Lawhead, is a 40 y.o. female   MRN: [id number]   DOB - April 27, 1976  Admit Date - 12/23/2015    Outpatient Primary MD for the patient is No PCP Per Patient  Consult requested in the Hospital by Milagros Loll, MD, On 12/24/2015    Reason for consult discoloration to right fourth toe. Patient is in ICU. Uncontrolled diabetes.   With History of -  Past Medical History  Diagnosis Date  . Diabetes mellitus without complication (HCC)   . Hypertension   . Hypercholesteremia       Past Surgical History  Procedure Laterality Date  . Refractive surgery      in for   Chief Complaint  Patient presents with  . Foot Pain     HPI  Ericia Moxley  is a 40 y.o. female, She has a history of uncontrolled diabetes with a recent increase in pain to the right foot. She's noted discoloration to the right fourth toe with the distal portion toe turning black. She also seen increased color changes to other digits in the right foot.    Review of Systems  patient is morbidly obese and has a history of heavy smoking.    Social History Social History  Substance Use Topics  . Smoking status: Current Every Day Smoker -- 0.50 packs/day    Types: Cigarettes  . Smokeless tobacco: Not on file  . Alcohol Use: No    Family History Family History  Problem Relation Age of Onset  . Hypertension Mother   . Diabetes Mother   . Stroke Mother   . Hyperlipidemia Father   . Thyroid disease Father   . Hypertension Father   . Diabetes Father   . Esophageal cancer Father      Prior to Admission medications   Medication Sig Start Date End Date Taking? Authorizing Provider  clindamycin (CLEOCIN) 300 MG capsule Take 300 mg by mouth 2 (two) times daily. 12/14/15 12/24/15 Yes Historical Provider, MD  diphenhydrAMINE  (BENADRYL) 50 MG capsule Take 100 mg by mouth at bedtime as needed for sleep.   Yes Historical Provider, MD  furosemide (LASIX) 20 MG tablet Take 1 tablet (20 mg total) by mouth daily. 11/06/15 11/05/16 Yes Sharman Cheek, MD  hydrochlorothiazide (HYDRODIURIL) 25 MG tablet Take 1 tablet (25 mg total) by mouth daily. 11/06/15  Yes Sharman Cheek, MD  insulin NPH-regular Human (NOVOLIN 70/30) (70-30) 100 UNIT/ML injection Inject 90 Units into the skin 2 (two) times daily with a meal.   Yes Historical Provider, MD  lisinopril (PRINIVIL,ZESTRIL) 20 MG tablet Take 1 tablet (20 mg total) by mouth daily. 11/06/15 11/05/16 Yes Sharman Cheek, MD  metFORMIN (GLUCOPHAGE) 500 MG tablet Take 1 tablet (500 mg total) by mouth 2 (two) times daily with a meal. 11/06/15  Yes Sharman Cheek, MD  metoprolol (LOPRESSOR) 50 MG tablet Take 1 tablet (50 mg total) by mouth 2 (two) times daily. 09/20/15 09/19/16 Yes Rebecka Apley, MD  simvastatin (ZOCOR) 20 MG tablet Take 20 mg by mouth at bedtime.   Yes Historical Provider, MD  albuterol (PROVENTIL HFA;VENTOLIN HFA) 108 (90 BASE) MCG/ACT inhaler Inhale 2 puffs into the lungs every  6 (six) hours as needed for wheezing or shortness of breath. Patient not taking: Reported on 12/23/2015 09/22/15   Darien Ramusavid W Kaminski, MD  cephALEXin (KEFLEX) 500 MG capsule Take 1 capsule (500 mg total) by mouth 3 (three) times daily. Patient not taking: Reported on 12/23/2015 11/19/15   Sharman CheekPhillip Stafford, MD  glipiZIDE (GLUCOTROL) 5 MG tablet Take 0.5 tablets (2.5 mg total) by mouth 2 (two) times daily before a meal. Patient not taking: Reported on 12/23/2015 09/27/15   Milagros LollSrikar Sudini, MD  hydrALAZINE (APRESOLINE) 100 MG tablet Take 1 tablet (100 mg total) by mouth 3 (three) times daily. Patient not taking: Reported on 12/23/2015 09/27/15   Milagros LollSrikar Sudini, MD  levofloxacin (LEVAQUIN) 250 MG tablet Take 3 tablets (750 mg total) by mouth daily. Patient not taking: Reported on 12/23/2015 09/27/15   Milagros LollSrikar  Sudini, MD  predniSONE (DELTASONE) 50 MG tablet Take 1 tablet (50 mg total) by mouth daily with breakfast. Patient not taking: Reported on 12/23/2015 09/27/15   Milagros LollSrikar Sudini, MD    Anti-infectives    None      Scheduled Meds: . insulin aspart protamine- aspart  60 Units Subcutaneous BID WC  . lisinopril  20 mg Oral Daily  . metoprolol  50 mg Oral BID  . simvastatin  20 mg Oral QHS   Continuous Infusions: . 0.9 % NaCl with KCl 40 mEq / L 100 mL/hr (12/24/15 1105)  . heparin 2,500 Units/hr (12/24/15 0746)  . insulin (NOVOLIN-R) infusion Stopped (12/24/15 1000)   PRN Meds:.acetaminophen **OR** acetaminophen, albuterol, diphenhydrAMINE, hydrALAZINE, HYDROcodone-acetaminophen, morphine injection, ondansetron **OR** ondansetron (ZOFRAN) IV, senna-docusate  No Known Allergies  Physical Exam  Vitals  Blood pressure 155/75, pulse 69, temperature 97.8 F (36.6 C), temperature source Oral, resp. rate 17, height 6' (1.829 m), weight 170.099 kg (375 lb), last menstrual period 12/21/2015, SpO2 100 %.  Lower Extremity exam:  Vascular:Nonpalpable pulses bilaterally. Left foot has some capillary refill time. Right foot has no capillary refill time. Cyanosis to all the digits on the right foot compared to the left. Patient has noted edema and swelling consisting of lymphedema and venous stasis edema bilaterally. Multiple excoriations to the lower right ankle and foot with no evidence of severe infection.  Dermatological: Necrotic tissue proximal and 1 cm in diameter tip of the fourth toe. No open wound O specific drainage or pus no evidence of redness or cellulitis.  Neurological: Likely some peripheral neuropathy. Difficult to assess today.  Ortho: No gross deformities but is noted patient has significant lymphedema and venous stasis edema to the right and left feet and legs.  Data Review  CBC  Recent Labs Lab 12/23/15 1722 12/24/15 0146  WBC 8.2 9.3  HGB 15.9 15.0  HCT 48.6* 43.5   PLT 209 230  MCV 91.7 87.2  MCH 30.0 30.0  MCHC 32.7 34.4  RDW 13.4 12.8  LYMPHSABS 2.2  --   MONOABS 0.5  --   EOSABS 0.2  --   BASOSABS 0.1  --    ------------------------------------------------------------------------------------------------------------------  Chemistries   Recent Labs Lab 12/23/15 1722 12/24/15 0146  NA 122* 128*  K 2.9* 2.7*  CL 88* 98*  CO2 25 20*  GLUCOSE 806* 294*  BUN 19 20  CREATININE 1.84* 1.84*  CALCIUM 7.9* 8.1*  MG  --  1.9  AST 32  --   ALT 28  --   ALKPHOS 105  --   BILITOT 0.3  --    ------------------------------------------------------------------------------------------------------------------ estimated creatinine clearance is 73.2 mL/min (by  C-G formula based on Cr of 1.84). ------------------------------------------------------------------------------------------------------------------ No results for input(s): TSH, T4TOTAL, T3FREE, THYROIDAB in the last 72 hours.  Invalid input(s): FREET3   Coagulation profile No results for input(s): INR, PROTIME in the last 168 hours. ------------------------------------------------------------------------------------------------------------------- No results for input(s): DDIMER in the last 72 hours. -------------------------------------------------------------------------------------------------------------------  Cardiac Enzymes  Recent Labs Lab 12/23/15 1722  TROPONINI <0.03   ------------------------------------------------------------------------------------------------------------------ Invalid input(s): POCBNP   ---------------------------------------------------------------------------------------------------------------  Urinalysis    Component Value Date/Time   COLORURINE YELLOW* 12/23/2015 1850   COLORURINE Amber 02/09/2014 1648   APPEARANCEUR HAZY* 12/23/2015 1850   APPEARANCEUR Cloudy 02/09/2014 1648   LABSPEC 1.023 12/23/2015 1850   LABSPEC 1.020 02/09/2014  1648   PHURINE 6.0 12/23/2015 1850   PHURINE 5.0 02/09/2014 1648   GLUCOSEU >500* 12/23/2015 1850   GLUCOSEU 50 mg/dL 16/07/9603 5409   HGBUR 3+* 12/23/2015 1850   HGBUR 2+ 02/09/2014 1648   BILIRUBINUR NEGATIVE 12/23/2015 1850   BILIRUBINUR Negative 02/09/2014 1648   KETONESUR NEGATIVE 12/23/2015 1850   KETONESUR Negative 02/09/2014 1648   PROTEINUR >500* 12/23/2015 1850   PROTEINUR >=500 02/09/2014 1648   UROBILINOGEN 0.2 08/13/2008 0954   NITRITE NEGATIVE 12/23/2015 1850   NITRITE Negative 02/09/2014 1648   LEUKOCYTESUR NEGATIVE 12/23/2015 1850   LEUKOCYTESUR 2+ 02/09/2014 1648     Imaging results:   Dg Foot Complete Right  12/23/2015  CLINICAL DATA:  Fourth digit is black and swollen today. Something dropped on the third digit the other day in the toe nail is coming off. History of diabetes. EXAM: RIGHT FOOT COMPLETE - 3+ VIEW COMPARISON:  05/30/2010 FINDINGS: Diffuse soft tissue swelling over the right foot and ankle. No radiopaque soft tissue foreign bodies or soft tissue gas is identified. No evidence of acute fracture or dislocation. No focal bone lesion or bone destruction. No cortical loss. No changes to suggest osteomyelitis. Mild degenerative changes in the intertarsal joints. IMPRESSION: Diffuse soft tissue swelling. No acute bony abnormalities. No findings to suggest osteomyelitis. Electronically Signed   By: Burman Nieves M.D.   On: 12/23/2015 18:33    Assessment & Plan: Patient has increased vascular compromise to the right lower extremity as compared to the left. Cyanosis to the digits with some necrotic tissue the tip of the fourth toe. Currently no active infection swelling redness or drainage from the right foot. Plan: Patient needs vascular evaluation and Z-plasty as soon as possible. Once she has the angioplasty done hopefully we can follow her up with minimal damage to the skin of the right foot and toes.  Principal Problem:   Diabetic hyperosmolar non-ketotic  state [facility])     Family Communication: Plan discussed with patient .  Epimenio Sarin M.D on 12/24/2015 at 11:19 AM  Thank you for the consult, we will follow the patient with you in the Hospital.

## 2015-12-24 NOTE — Progress Notes (Signed)
Patient has order for IV fluids with potassium and heparin drip.  Confirmed with Lennox LaityJodi in pharmacy that potassium and Heparin are not compatible.  Patient has only one IV access that is workable and labs have not been able to draw blood today due to limited access.  Spoke with Dr. Elpidio AnisSudini and received order for PICC.  Order placed. Loel RoVerne H. Rico Massar, RN 12/24/15 [phone]

## 2015-12-24 NOTE — Progress Notes (Signed)
[facility] [place] [facility]   PATIENT NAME: Bianca GardenerSusan Hill    MR#:  [id number]  DATE OF BIRTH:  1976-09-20  SUBJECTIVE:  CHIEF COMPLAINT:   Chief Complaint  Patient presents with  . Foot Pain   Admitted for hyperglycemia and right foot pain with gangrene of right fourth toe. Patient ran out of her insulin and Accu strips for 2 weeks. Blood sugars used to run between 100-140 prior to that with episodes of hypoglycemia as low as 40. Has chronic lower extremity edema. Has not seen primary care physician since June 2016.  REVIEW OF SYSTEMS:    Review of Systems  Constitutional: Positive for malaise/fatigue. Negative for fever and chills.  HENT: Negative for sore throat.   Eyes: Negative for blurred vision, double vision and pain.  Respiratory: Negative for cough, hemoptysis, shortness of breath and wheezing.   Cardiovascular: Positive for leg swelling. Negative for chest pain, palpitations and orthopnea.  Gastrointestinal: Positive for nausea. Negative for heartburn, vomiting, abdominal pain, diarrhea and constipation.  Genitourinary: Negative for dysuria and hematuria.  Musculoskeletal: Negative for back pain and joint pain.  Skin: Negative for rash.  Neurological: Positive for dizziness and weakness. Negative for sensory change, speech change, focal weakness and headaches.  Endo/Heme/Allergies: Does not bruise/bleed easily.  Psychiatric/Behavioral: Negative for depression. The patient is not nervous/anxious.     DRUG ALLERGIES:  No Known Allergies  VITALS:  Blood pressure 140/90, pulse 65, temperature 97.8 F (36.6 C), temperature source Oral, resp. rate 14, height 6' (1.829 m), weight 170.099 kg (375 lb), last menstrual period 12/21/2015, SpO2 100 %.  PHYSICAL EXAMINATION:   Physical Exam  GENERAL:  40 y.o.-year-old patient lying in the bed with no acute distress. Morbidly obese EYES: Pupils equal, round, reactive to light and  accommodation. No scleral icterus. Extraocular muscles intact.  HEENT: Head atraumatic, normocephalic. Oropharynx and nasopharynx clear.  NECK:  Supple, no jugular venous distention. No thyroid enlargement, no tenderness.  LUNGS: Normal breath sounds bilaterally, no wheezing, rales, rhonchi. No use of accessory muscles of respiration.  CARDIOVASCULAR: S1, S2 normal. No murmurs, rubs, or gallops.  ABDOMEN: Soft, nontender, nondistended. Bowel sounds present. No organomegaly or mass.  EXTREMITIES: No cyanosis, clubbing or edema b/l.    NEUROLOGIC: Cranial nerves II through XII are intact. No focal Motor or sensory deficits b/l.   PSYCHIATRIC: The patient is alert and oriented x 3.  SKIN: Right fourth toe gangrenous area  LABORATORY PANEL:   CBC  Recent Labs Lab 12/24/15 0146  WBC 9.3  HGB 15.0  HCT 43.5  PLT 230   ------------------------------------------------------------------------------------------------------------------ Chemistries   Recent Labs Lab 12/23/15 1722 12/24/15 0146  NA 122* 128*  K 2.9* 2.7*  CL 88* 98*  CO2 25 20*  GLUCOSE 806* 294*  BUN 19 20  CREATININE 1.84* 1.84*  CALCIUM 7.9* 8.1*  MG  --  1.9  AST 32  --   ALT 28  --   ALKPHOS 105  --   BILITOT 0.3  --    ------------------------------------------------------------------------------------------------------------------  Cardiac Enzymes  Recent Labs Lab 12/23/15 1722  TROPONINI <0.03   ------------------------------------------------------------------------------------------------------------------  RADIOLOGY:  Dg Foot Complete Right  12/23/2015  CLINICAL DATA:  Fourth digit is black and swollen today. Something dropped on the third digit the other day in the toe nail is coming off. History of diabetes. EXAM: RIGHT FOOT COMPLETE - 3+ VIEW COMPARISON:  05/30/2010 FINDINGS: Diffuse soft tissue swelling over the right foot and ankle. No  radiopaque soft tissue foreign bodies or soft tissue  gas is identified. No evidence of acute fracture or dislocation. No focal bone lesion or bone destruction. No cortical loss. No changes to suggest osteomyelitis. Mild degenerative changes in the intertarsal joints. IMPRESSION: Diffuse soft tissue swelling. No acute bony abnormalities. No findings to suggest osteomyelitis. Electronically Signed   By: Burman Nieves M.D.   On: 12/23/2015 18:33     ASSESSMENT AND PLAN:   * Hyper osmolar hyperglycemic state Due to not taking her insulin for 2 weeks. On insulin drip and IV fluids. Metformin held. Will start insulin 70/30. Sliding scale insulin. Patient takes 90 units of insulin twice a day. We'll start at 60 units here as patient has had hypoglycemic episodes at home.  * Pseudohyponatremia. Possible due to hyperglycemia.  * Right fourth toe necrosis. Continue heparin drip  follow-up vascular surgery and podiatry consult.  * Hypokalemia.  Replace orally  * Hypertension Continue lisinopril and Lopressor with IV hydralazine when necessary. Hold Lasix and HCTZ.  * CKD stage III. Stable.  * Tobacco abuse.  Counseled on admission  * Morbid obesity.  All the records are reviewed and case discussed with Care Management/Social Workerr. Management plans discussed with the patient, family and they are in agreement.  CODE STATUS: FULL  DVT Prophylaxis: SCDs  TOTAL CC TIME TAKING CARE OF THIS PATIENT: 35 minutes.   POSSIBLE D/C IN 2-3 DAYS, DEPENDING ON CLINICAL CONDITION.  Milagros Loll R M.D on 12/24/2015 at 9:37 AM  Between 7am to 6pm - Pager - [phone]  After 6pm go to [email or web address] - password [email or web address] [facility]  [facility]  Office  [phone]  CC: Primary care physician; No PCP Per Patient  Note: This dictation was prepared with Dragon dictation along with smaller phrase technology. Any transcriptional errors that result from this process are unintentional.

## 2015-12-25 ENCOUNTER — Encounter: Payer: Self-pay | Admitting: Internal Medicine

## 2015-12-25 LAB — CBC WITH DIFFERENTIAL/PLATELET
BASOS ABS: 0.1 10*3/uL (ref 0–0.1)
Basophils Relative: 1 %
Eosinophils Absolute: 0.4 10*3/uL (ref 0–0.7)
Eosinophils Relative: 4 %
HEMATOCRIT: 40.8 % (ref 35.0–47.0)
HEMOGLOBIN: 14 g/dL (ref 12.0–16.0)
LYMPHS PCT: 44 %
Lymphs Abs: 4.2 10*3/uL — ABNORMAL HIGH (ref 1.0–3.6)
MCH: 30.6 pg (ref 26.0–34.0)
MCHC: 34.3 g/dL (ref 32.0–36.0)
MCV: 89.4 fL (ref 80.0–100.0)
Monocytes Absolute: 0.5 10*3/uL (ref 0.2–0.9)
Monocytes Relative: 6 %
NEUTROS ABS: 4.3 10*3/uL (ref 1.4–6.5)
Neutrophils Relative %: 45 %
Platelets: 184 10*3/uL (ref 150–440)
RBC: 4.56 MIL/uL (ref 3.80–5.20)
RDW: 13.3 % (ref 11.5–14.5)
WBC: 9.5 10*3/uL (ref 3.6–11.0)

## 2015-12-25 LAB — BASIC METABOLIC PANEL
ANION GAP: 5 (ref 5–15)
BUN: 22 mg/dL — ABNORMAL HIGH (ref 6–20)
CO2: 22 mmol/L (ref 22–32)
Calcium: 7.6 mg/dL — ABNORMAL LOW (ref 8.9–10.3)
Chloride: 102 mmol/L (ref 101–111)
Creatinine, Ser: 1.87 mg/dL — ABNORMAL HIGH (ref 0.44–1.00)
GFR calc Af Amer: 38 mL/min — ABNORMAL LOW (ref >60)
GFR, EST NON AFRICAN AMERICAN: 33 mL/min — AB (ref >60)
GLUCOSE: 162 mg/dL — AB (ref 65–99)
POTASSIUM: 3.8 mmol/L (ref 3.5–5.1)
Sodium: 129 mmol/L — ABNORMAL LOW (ref 135–145)

## 2015-12-25 LAB — PROTIME-INR
INR: 0.98
Prothrombin Time: 13.2 seconds (ref 11.4–15.0)

## 2015-12-25 LAB — GLUCOSE, CAPILLARY
GLUCOSE-CAPILLARY: 110 mg/dL — AB (ref 65–99)
GLUCOSE-CAPILLARY: 72 mg/dL (ref 65–99)
Glucose-Capillary: 175 mg/dL — ABNORMAL HIGH (ref 65–99)
Glucose-Capillary: 137 mg/dL — ABNORMAL HIGH (ref 65–99)

## 2015-12-25 LAB — HEMOGLOBIN A1C: Hgb A1c MFr Bld: 10.5 % — ABNORMAL HIGH (ref 4.0–6.0)

## 2015-12-25 LAB — HEPARIN LEVEL (UNFRACTIONATED): Heparin Unfractionated: 0.68 IU/mL (ref 0.30–0.70)

## 2015-12-25 MED ORDER — DEXTROSE 5 % IV SOLN
1.0000 g | INTRAVENOUS | Status: DC
  Administered 2015-12-25 – 2015-12-27 (×2): 1 g via INTRAVENOUS
  Filled 2015-12-25 (×3): qty 10

## 2015-12-25 MED ORDER — FUROSEMIDE 40 MG PO TABS
20.0000 mg | ORAL_TABLET | Freq: Every day | ORAL | Status: DC
  Administered 2015-12-25 – 2015-12-27 (×3): 20 mg via ORAL
  Filled 2015-12-25 (×2): qty 1
  Filled 2015-12-25: qty 2

## 2015-12-25 MED ORDER — SODIUM CHLORIDE 0.9% FLUSH: 10.0000 mL | INTRAVENOUS | Status: DC | PRN

## 2015-12-25 NOTE — Progress Notes (Signed)
ANTICOAGULATION CONSULT NOTE - Initial Consult  Pharmacy Consult for Heparin  Indication: DVT, ischemic leg   No Known Allergies  Patient Measurements: Height: 6' (182.9 cm) Weight: (!) 375 lb (170.099 kg) IBW/kg (Calculated) : 73.1 Heparin Dosing Weight: 115 kg   Vital Signs: Temp: 98.3 F (36.8 C) (03/25 2033) Temp Source: Oral (03/25 2033) BP: 128/71 mmHg (03/25 2033) Pulse Rate: 71 (03/25 2033)  Labs:  Recent Labs  12/23/15 1722 12/24/15 0146 12/24/15 0243 12/24/15 1928 12/25/15 0345 12/25/15 0417  HGB 15.9 15.0  --   --   --  14.0  HCT 48.6* 43.5  --   --   --  40.8  PLT 209 230  --   --   --  184  APTT 24  --   --   --   --   --   LABPROT  --   --   --   --  13.2  --   INR  --   --   --   --  0.98  --   HEPARINUNFRC  --   --  <0.10* 1.05* 0.68  --   CREATININE 1.84* 1.84*  --  2.10*  --  1.87*  TROPONINI <0.03  --   --   --   --   --     Estimated Creatinine Clearance: 72.1 mL/min (by C-G formula based on Cr of 1.87).   Medical History: Past Medical History  Diagnosis Date  . Diabetes mellitus without complication (HCC)   . Hypertension   . Hypercholesteremia     Medications:  Prescriptions prior to admission  Medication Sig Dispense Refill Last Dose  . [EXPIRED] clindamycin (CLEOCIN) 300 MG capsule Take 300 mg by mouth 2 (two) times daily.   12/23/2015 at Unknown time  . diphenhydrAMINE (BENADRYL) 50 MG capsule Take 100 mg by mouth at bedtime as needed for sleep.   12/22/2015 at Unknown time  . furosemide (LASIX) 20 MG tablet Take 1 tablet (20 mg total) by mouth daily. 30 tablet 2 12/23/2015 at Unknown time  . hydrochlorothiazide (HYDRODIURIL) 25 MG tablet Take 1 tablet (25 mg total) by mouth daily. 30 tablet 2 12/23/2015 at Unknown time  . insulin NPH-regular Human (NOVOLIN 70/30) (70-30) 100 UNIT/ML injection Inject 90 Units into the skin 2 (two) times daily with a meal.   Past Month at Unknown time  . lisinopril (PRINIVIL,ZESTRIL) 20 MG tablet Take 1  tablet (20 mg total) by mouth daily. 30 tablet 2 12/23/2015 at Unknown time  . metFORMIN (GLUCOPHAGE) 500 MG tablet Take 1 tablet (500 mg total) by mouth 2 (two) times daily with a meal. 60 tablet 2 12/23/2015 at Unknown time  . metoprolol (LOPRESSOR) 50 MG tablet Take 1 tablet (50 mg total) by mouth 2 (two) times daily. 60 tablet 0 12/23/2015 at 0900  . simvastatin (ZOCOR) 20 MG tablet Take 20 mg by mouth at bedtime.   12/22/2015 at Unknown time  . albuterol (PROVENTIL HFA;VENTOLIN HFA) 108 (90 BASE) MCG/ACT inhaler Inhale 2 puffs into the lungs every 6 (six) hours as needed for wheezing or shortness of breath. (Patient not taking: Reported on 12/23/2015) 1 Inhaler 2 11/18/2015 at Unknown time  . cephALEXin (KEFLEX) 500 MG capsule Take 1 capsule (500 mg total) by mouth 3 (three) times daily. (Patient not taking: Reported on 12/23/2015) 21 capsule 0   . glipiZIDE (GLUCOTROL) 5 MG tablet Take 0.5 tablets (2.5 mg total) by mouth 2 (two) times daily before a meal. (  Patient not taking: Reported on 12/23/2015) 60 tablet 0   . hydrALAZINE (APRESOLINE) 100 MG tablet Take 1 tablet (100 mg total) by mouth 3 (three) times daily. (Patient not taking: Reported on 12/23/2015) 90 tablet 0   . levofloxacin (LEVAQUIN) 250 MG tablet Take 3 tablets (750 mg total) by mouth daily. (Patient not taking: Reported on 12/23/2015) 6 tablet 0   . predniSONE (DELTASONE) 50 MG tablet Take 1 tablet (50 mg total) by mouth daily with breakfast. (Patient not taking: Reported on 12/23/2015) 4 tablet 0     Assessment: Pharmacy consulted to dose heparin in this 40 year old female admitted with ischemic leg. No prior anticoag noted.    Pt had a PICC line placed due to access issues.  HL 3/25 @ 1928 = 1.05 HL 3/26 @ 0345 = 0.68  Goal of Therapy:  Heparin level 0.3-0.7 units/ml Monitor platelets by anticoagulation protocol: Yes   Plan:  Heparin level at goal. Continue current regimen and recheck in 6 hours to confirm.   Fulton ReekMatt Tatsuya Okray,  PharmD, BCPS  12/25/2015,5:07 AM

## 2015-12-25 NOTE — Progress Notes (Signed)
S/p education on falls risk, patient refused bed alarm. Reminded patient to call out if she wants to get out of bed to prevent harm to self. Loel RoVerne H. Rehanna Oloughlin, RN 12/25/15 [phone]

## 2015-12-25 NOTE — Progress Notes (Signed)
[facility] - [place] at [facility]   PATIENT NAME: Bianca Hill    MR#:  [id number]  DATE OF BIRTH:  02/08/1976  SUBJECTIVE:  CHIEF COMPLAINT:   Chief Complaint  Patient presents with  . Foot Pain   Admitted for hyperglycemia and right foot pain with gangrene of right fourth toe. Patient ran out of her insulin and Accu strips for 2 weeks. Blood sugars used to run between 100-140 prior to that with episodes of hypoglycemia as low as 40. Has chronic lower extremity edema. Has not seen primary care physician since June 2016.  Continues to have right foot pain. Is worried her swelling in the legs is getting worse with IV fluids. Blood sugars improved. On heparin drip. Patient is requesting transfer to [facility].  REVIEW OF SYSTEMS:    Review of Systems  Constitutional: Positive for malaise/fatigue. Negative for fever and chills.  HENT: Negative for sore throat.   Eyes: Negative for blurred vision, double vision and pain.  Respiratory: Negative for cough, hemoptysis, shortness of breath and wheezing.   Cardiovascular: Positive for leg swelling. Negative for chest pain, palpitations and orthopnea.  Gastrointestinal: Positive for nausea. Negative for heartburn, vomiting, abdominal pain, diarrhea and constipation.  Genitourinary: Negative for dysuria and hematuria.  Musculoskeletal: Negative for back pain and joint pain.  Skin: Negative for rash.  Neurological: Positive for dizziness and weakness. Negative for sensory change, speech change, focal weakness and headaches.  Endo/Heme/Allergies: Does not bruise/bleed easily.  Psychiatric/Behavioral: Negative for depression. The patient is not nervous/anxious.     DRUG ALLERGIES:  No Known Allergies  VITALS:  Blood pressure 130/88, pulse 75, temperature 98.3 F (36.8 C), temperature source Oral, resp. rate 17, height 6' (1.829 m), weight 170.099 kg (375 lb), last menstrual period 12/21/2015, SpO2 100  %.  PHYSICAL EXAMINATION:   Physical Exam  GENERAL:  40 y.o.-year-old patient lying in the bed with no acute distress. Morbidly obese EYES: Pupils equal, round, reactive to light and accommodation. No scleral icterus. Extraocular muscles intact.  HEENT: Head atraumatic, normocephalic. Oropharynx and nasopharynx clear.  NECK:  Supple, no jugular venous distention. No thyroid enlargement, no tenderness.  LUNGS: Normal breath sounds bilaterally, no wheezing, rales, rhonchi. No use of accessory muscles of respiration.  CARDIOVASCULAR: S1, S2 normal. No murmurs, rubs, or gallops.  ABDOMEN: Soft, nontender, nondistended. Bowel sounds present. No organomegaly or mass.  EXTREMITIES: Bilateral lower extremity edema NEUROLOGIC: Cranial nerves II through XII are intact. No focal Motor or sensory deficits b/l.   PSYCHIATRIC: The patient is alert and oriented x 3.  SKIN: Right fourth toe gangrenous area  LABORATORY PANEL:   CBC  Recent Labs Lab 12/25/15 0417  WBC 9.5  HGB 14.0  HCT 40.8  PLT 184   ------------------------------------------------------------------------------------------------------------------ Chemistries   Recent Labs Lab 12/23/15 1722 12/24/15 0146  12/25/15 0417  NA 122* 128*  < > 129*  K 2.9* 2.7*  < > 3.8  CL 88* 98*  < > 102  CO2 25 20*  < > 22  GLUCOSE 806* 294*  < > 162*  BUN 19 20  < > 22*  CREATININE 1.84* 1.84*  < > 1.87*  CALCIUM 7.9* 8.1*  < > 7.6*  MG  --  1.9  --   --   AST 32  --   --   --   ALT 28  --   --   --   ALKPHOS 105  --   --   --  BILITOT 0.3  --   --   --   < > = values in this interval not displayed. ------------------------------------------------------------------------------------------------------------------  Cardiac Enzymes  Recent Labs Lab 12/23/15 1722  TROPONINI <0.03   ------------------------------------------------------------------------------------------------------------------  RADIOLOGY:  Dg Chest Port 1  View  12/24/2015  CLINICAL DATA:  Status post PICC line placement EXAM: PORTABLE CHEST 1 VIEW COMPARISON:  11/06/2015 FINDINGS: Cardiac shadow is stable. The lungs are well aerated bilaterally. A right-sided PICC line is noted with the catheter tip at the level of the cavoatrial junction. No other focal abnormality is seen. IMPRESSION: Status post PICC line in satisfactory position. Electronically Signed   By: Alcide CleverMark  Lukens M.D.   On: 12/24/2015 18:48   Dg Foot Complete Right  12/23/2015  CLINICAL DATA:  Fourth digit is black and swollen today. Something dropped on the third digit the other day in the toe nail is coming off. History of diabetes. EXAM: RIGHT FOOT COMPLETE - 3+ VIEW COMPARISON:  05/30/2010 FINDINGS: Diffuse soft tissue swelling over the right foot and ankle. No radiopaque soft tissue foreign bodies or soft tissue gas is identified. No evidence of acute fracture or dislocation. No focal bone lesion or bone destruction. No cortical loss. No changes to suggest osteomyelitis. Mild degenerative changes in the intertarsal joints. IMPRESSION: Diffuse soft tissue swelling. No acute bony abnormalities. No findings to suggest osteomyelitis. Electronically Signed   By: Burman NievesWilliam  Stevens M.D.   On: 12/23/2015 18:33     ASSESSMENT AND PLAN:   * Hyper osmolar hyperglycemic state - resolved Due to not taking her insulin for 2 weeks. Stop IV fluids. She was much improved. Patient has been restarted on insulin 70/30 16 units 2 times a day. Sliding scale insulin per  * Pseudohyponatremia. Improving  * Right fourth toe necrosis. Stop heparin drip as discussed with vascular surgery.  Scheduled for lower extremity angiogram tomorrow  * Hypokalemia.  Replace orally  * Hypertension Continue lisinopril and Lopressor with IV hydralazine when necessary. Hold Lasix and HCTZ.  * CKD stage III. Stable.  * Tobacco abuse.  Counseled on admission  * Morbid obesity.  All the records are reviewed and  case discussed with Care Management/Social Workerr. Management plans discussed with the patient, family and they are in agreement.  CODE STATUS: FULL  DVT Prophylaxis: SCDs  TOTAL TIME TAKING CARE OF THIS PATIENT: 35 minutes.   [facility] the transfer center and waiting to hear back from the physician.  Milagros LollSudini, Maliyah Willets R M.D on 12/25/2015 at 12:19 PM  Between 7am to 6pm - Pager - [phone]  After 6pm go to [email or web address] - password [email or web address] [facility]  [facility]  Office  [phone]  CC: Primary care physician; No PCP Per Patient  Note: This dictation was prepared with Dragon dictation along with smaller phrase technology. Any transcriptional errors that result from this process are unintentional.

## 2015-12-26 ENCOUNTER — Encounter
Admission: EM | Disposition: A | Discharge status: Home / Self Care | Payer: Self-pay | Source: Home / Self Care | Attending: Internal Medicine

## 2015-12-26 HISTORY — PX: PERIPHERAL VASCULAR CATHETERIZATION: SHX172C

## 2015-12-26 LAB — GLUCOSE, CAPILLARY
GLUCOSE-CAPILLARY: 181 mg/dL — AB (ref 65–99)
GLUCOSE-CAPILLARY: 180 mg/dL — AB (ref 65–99)
Glucose-Capillary: 276 mg/dL — ABNORMAL HIGH (ref 65–99)

## 2015-12-26 SURGERY — LOWER EXTREMITY ANGIOGRAPHY
Anesthesia: Moderate Sedation

## 2015-12-26 MED ORDER — ALTEPLASE 2 MG IJ SOLR
2.0000 mg | Freq: Once | INTRAMUSCULAR | Status: AC
  Administered 2015-12-26: 2 mg
  Filled 2015-12-26: qty 2

## 2015-12-26 MED ORDER — MIDAZOLAM HCL 2 MG/2ML IJ SOLN
INTRAMUSCULAR | Status: AC
  Filled 2015-12-26: qty 2

## 2015-12-26 MED ORDER — HEPARIN (PORCINE) IN NACL 2-0.9 UNIT/ML-% IJ SOLN
INTRAMUSCULAR | Status: AC
  Filled 2015-12-26: qty 1000

## 2015-12-26 MED ORDER — FENTANYL CITRATE (PF) 100 MCG/2ML IJ SOLN
INTRAMUSCULAR | Status: DC | PRN
  Administered 2015-12-26 (×3): 50 ug via INTRAVENOUS
  Administered 2015-12-26 (×2): 25 ug via INTRAVENOUS
  Administered 2015-12-26 (×2): 50 ug via INTRAVENOUS

## 2015-12-26 MED ORDER — HEPARIN SODIUM (PORCINE) 1000 UNIT/ML IJ SOLN
INTRAMUSCULAR | Status: AC
  Filled 2015-12-26: qty 1

## 2015-12-26 MED ORDER — FENTANYL CITRATE (PF) 100 MCG/2ML IJ SOLN
INTRAMUSCULAR | Status: AC
  Filled 2015-12-26: qty 4

## 2015-12-26 MED ORDER — FENTANYL CITRATE (PF) 100 MCG/2ML IJ SOLN
INTRAMUSCULAR | Status: AC
  Filled 2015-12-26: qty 2

## 2015-12-26 MED ORDER — HYDROMORPHONE HCL 1 MG/ML IJ SOLN
1.0000 mg | Freq: Once | INTRAMUSCULAR | Status: AC
  Administered 2015-12-26: 1 mg via INTRAVENOUS

## 2015-12-26 MED ORDER — DIPHENHYDRAMINE HCL 50 MG/ML IJ SOLN
INTRAMUSCULAR | Status: AC
  Filled 2015-12-26: qty 1

## 2015-12-26 MED ORDER — HYDROMORPHONE HCL 1 MG/ML IJ SOLN
INTRAMUSCULAR | Status: AC
  Filled 2015-12-26: qty 1

## 2015-12-26 MED ORDER — HEPARIN SODIUM (PORCINE) 1000 UNIT/ML IJ SOLN
INTRAMUSCULAR | Status: DC | PRN
  Administered 2015-12-26: 6000 [IU] via INTRAVENOUS

## 2015-12-26 MED ORDER — LIDOCAINE-EPINEPHRINE (PF) 1 %-1:200000 IJ SOLN
INTRAMUSCULAR | Status: AC
  Filled 2015-12-26: qty 30

## 2015-12-26 MED ORDER — DIPHENHYDRAMINE HCL 50 MG/ML IJ SOLN
INTRAMUSCULAR | Status: DC | PRN
Start: 2015-12-26 — End: 2015-12-26
  Administered 2015-12-26: 50 mg via INTRAVENOUS

## 2015-12-26 MED ORDER — DEXTROSE 5 % IV SOLN
1.5000 g | Freq: Once | INTRAVENOUS | Status: AC
  Administered 2015-12-26: 1.5 g via INTRAVENOUS

## 2015-12-26 MED ORDER — APIXABAN 5 MG PO TABS
5.0000 mg | ORAL_TABLET | Freq: Two times a day (BID) | ORAL | Status: DC
  Administered 2015-12-26 – 2015-12-27 (×2): 5 mg via ORAL
  Filled 2015-12-26 (×2): qty 1

## 2015-12-26 MED ORDER — MIDAZOLAM HCL 5 MG/5ML IJ SOLN
INTRAMUSCULAR | Status: AC
  Filled 2015-12-26: qty 5

## 2015-12-26 MED ORDER — MIDAZOLAM HCL 2 MG/2ML IJ SOLN
INTRAMUSCULAR | Status: DC | PRN
  Administered 2015-12-26 (×5): 1 mg via INTRAVENOUS
  Administered 2015-12-26: 2 mg via INTRAVENOUS

## 2015-12-26 MED ORDER — IOPAMIDOL (ISOVUE-300) INJECTION 61%
INTRAVENOUS | Status: DC | PRN
  Administered 2015-12-26: 105 mL via INTRAVENOUS

## 2015-12-26 SURGICAL SUPPLY — 26 items
BALLN LUTONIX 5X150X130 (BALLOONS) ×3
BALLN LUTONIX 6X150X130 (BALLOONS) ×3
BALLN LUTONIX DCB 4X100X130 (BALLOONS) ×3
BALLN ULTRVRSE 2.5X300X150 (BALLOONS) ×3
BALLN ULTRVRSE 6X200X130 (BALLOONS) ×3
BALLOON LUTONIX 5X150X130 (BALLOONS) IMPLANT
BALLOON LUTONIX 6X150X130 (BALLOONS) IMPLANT
BALLOON LUTONIX DCB 4X100X130 (BALLOONS) IMPLANT
BALLOON ULTRVRSE 2.5X300X150 (BALLOONS) IMPLANT
BALLOON ULTRVRSE 6X200X130 (BALLOONS) IMPLANT
CANNULA 5F STIFF (CANNULA) ×3 IMPLANT
CATH CXI SUPP ANG 4FR 135 (MICROCATHETER) IMPLANT
CATH CXI SUPP ANG 4FR 135CM (MICROCATHETER) ×3
CATH PIG 70CM (CATHETERS) ×3 IMPLANT
CATH VERT 100CM (CATHETERS) ×3 IMPLANT
DEVICE PRESTO INFLATION (MISCELLANEOUS) ×2 IMPLANT
DEVICE STARCLOSE SE CLOSURE (Vascular Products) ×2 IMPLANT
GLIDEWIRE ADV .035X260CM (WIRE) ×3 IMPLANT
PACK ANGIOGRAPHY (CUSTOM PROCEDURE TRAY) ×3 IMPLANT
SHEATH ANL2 6FRX45 HC (SHEATH) ×3 IMPLANT
SHEATH BRITE TIP 5FRX11 (SHEATH) ×3 IMPLANT
STENT VIABAHN 6X250X120 (Permanent Stent) ×2 IMPLANT
SYR MEDRAD MARK V 150ML (SYRINGE) ×3 IMPLANT
TUBING CONTRAST HIGH PRESS 72 (TUBING) ×3 IMPLANT
WIRE G V18X300CM (WIRE) ×2 IMPLANT
WIRE J 3MM .035X145CM (WIRE) ×3 IMPLANT

## 2015-12-26 NOTE — Progress Notes (Signed)
Pt here for right leg angiogram, as noted per Dr Wyn Quakerew, will monitor vitals throughout procedure as well as being monitored per vasc. Tech with etc02,pt presently awake, alert and oriented

## 2015-12-26 NOTE — Progress Notes (Signed)
Spoke to Dr. Elpidio AnisSudini about patient being NPO for procedure but has 60 units of 70/30 novolog and sliding scale insulin.  He ordered to hold the 70/30 but give the sliding scale

## 2015-12-26 NOTE — Op Note (Signed)
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 Ultrasound guidance for vascular access left femoral artery 2. Catheter placement into right anterior tibial artery, peroneal artery, and posterior tibial arteries including selective injections of each artery from left femoral approach 3. Aortogram and selective right lower extremity angiogram 4. Percutaneous transluminal angioplasty of right popliteal artery and superficial femoral artery with 5 mm diameter by 15 cm length Lutonix drug-coated angioplasty balloon and 6 mm diameter by 15 cm length Lutonix drug-coated angioplasty balloon 5. Viabahn covered stent placement to the distal SFA and popliteal artery with 6 mm diameter by 25 cm length stent for greater than 50% residual stenosis after angioplasty  6.  Percutaneous transluminal angioplasty of anterior tibial artery all the way to the foot with 2.5 mm diameter by 30 cm length angioplasty balloon and a 4 mm diameter by 10 cm length Lutonix drug-coated angioplasty balloon proximally  7.  Percutaneous transluminal angioplasty of the peroneal artery and tibioperoneal trunk with 2.5 mm diameter by 30 cm length angioplasty balloon  8.  Percutaneous transluminal angioplasty of the tibioperoneal trunk and proximal posterior tibial artery with 4 mm diameter by 10 cm length angioplasty balloon 9. StarClose closure device left femoral artery  EBL: 25 cc  Contrast: 105 cc  Fluro Time: 15.6 minutes  Moderate Conscious Sedation Time: approximately 75 minutes using 7 mg of Versed and 300 mcg of Fentanyl  Indications: Patient is a 40 y.o.female with gangrene of the right foot with no  palpable pedal pulses and only a faint posterior tibial Doppler signal. The patient is brought in for angiography for further evaluation and potential treatment. Risks and benefits are discussed and informed consent is obtained  Procedure: The patient was identified and appropriate procedural time out was performed. The patient was then placed supine on the table and prepped and draped in the usual sterile fashion.Moderate conscious sedation was administered during a face to face encounter with the patient throughout the procedure with my supervision of the RN administering medicines and monitoring the patient's vital signs, pulse oximetry, telemetry and mental status throughout from the start of the procedure until the patient was taken to the recovery room. Ultrasound was used to evaluate the left common femoral artery. It was patent . A digital ultrasound image was acquired. A Seldinger needle was used to access the left common femoral artery under direct ultrasound guidance and a permanent image was performed. A 0.035 J wire was advanced without resistance and a 5Fr sheath was placed. Pigtail catheter was placed into the aorta and an AP aortogram was performed. This demonstrated normal renal arteries and normal aorta and iliac segments without significant stenosis. I then crossed the aortic bifurcation and advanced to the right femoral head. Selective right lower extremity angiogram was then performed. This demonstrated occlusion of the mid to distal superficial femoral artery with poor reconstitution distally in the popliteal artery which then reoccluded at the tibial trifurcation. Difficult to opacify tibial vessels initially but very little flow was seen distally. The patient was systemically heparinized and a 6 [place] Ansell sheath was then placed over the [employer] wire. I then used a Kumpe catheter and the advantage wire to navigate through the SFA and above-knee popliteal artery occlusion  and confirm intraluminal flow in the popliteal artery below the knee. The wire then initially went through  the anterior tibial artery occlusion and I confirmed intraluminal flow in the distal anterior tibial artery down to the dorsalis pedis artery near the foot. I then replaced a 0.018 wire. The anterior tibial artery was treated with a 2.5 mm diameter by 30 cm length angioplasty balloon inflated twice with the distal inflation to 6 atm in the proximal inflation to 12 atm. The popliteal artery was then treated with a 5 mm diameter by 15 cm length Lutonix drug-coated angioplasty balloon inflated to 10 atm for 1 minute. The distal SFA and most proximal popliteal artery were then treated with a 6 mm diameter by 15 cm length Lutonix drug-coated angioplasty balloon inflated to 12 atm for 1 minute. Angiogram following this showed essentially no flow distally with continued near occlusive stenosis in the SFA, stenosis in the popliteal artery, and still no flow seen in the tibial vessels. Viabahn covered stent placement was then placed in the popliteal artery and up to the distal SFA with a 6 mm diameter by 25 cm length stent postdilated with a 6 mm balloon. I used a 4 mm diameter by 10 cm length Lutonix drug-coated angioplasty balloon to treat the proximal SFA and most distal popliteal artery just into the stent. This was inflated to 8 atm for 1 minute. At this point, the SFA and popliteal arteries had good flow, but the anterior tibial artery remained occluded as did the tibioperoneal trunk and both the peroneal and posterior tibial arteries. I then redirected with the CXI catheter and the 0.018 wire and cannulated the tibioperoneal trunk and advanced into the peroneal artery confirming intraluminal flow. With a wire parking the distal peroneal artery I took a 2.5 mm diameter by 30 cm length angioplasty balloon from the distal peroneal artery up through the tibioperoneal trunk. This was inflated to 10 atm for 1 minute.  With this, the peroneal artery remained occluded distally but now flow could be seen through the posterior tibial artery to the foot. There was however a near occlusive stenosis in the proximal posterior tibial artery. It was clear at this point even though we had treated the peroneal and anterior tibial arteries, our best bet getting in-line flow to the foot was going to be through this posterior tibial artery as it was continuous to the foot after this near occlusive proximal stenosis. Using magnified images I was able to gain access to the posterior tibial artery and cross the high-grade stenosis with the V 18 wire and the CXI catheter and confirm intraluminal flow in the posterior tibial artery distally. I then used the 4 mm diameter by 10 cm length angioplasty balloon in the tibioperoneal trunk and the first 5-6 cm of the posterior tibial artery and inflated this to 8 atm for 1 minute. Completion angiogram following this showed less than 30% residual stenosis in the posterior tibial artery with continuous flow to the foot. At this point, I felt we had improved her perfusion is much as possible given her extensive disease.. I elected to terminate the procedure. The sheath was removed and StarClose closure device was deployed in the left femoral artery with excellent hemostatic result. The patient was taken to the recovery room in stable condition having tolerated the procedure well.  Findings:  Aortogram: normal renal arteries, normal aorta and iliacs bilaterally right Lower Extremity: Occlusion of the mid to distal superficial femoral artery with poor reconstitution distally in the popliteal artery which then reoccluded at the tibial trifurcation. Difficult to opacify tibial vessels initially but  very little flow was seen distally.   Disposition: Patient was taken to the recovery room in stable condition having tolerated the procedure well.  Complications:  None  DEW,JASON 12/26/2015 5:44 PM

## 2015-12-26 NOTE — Progress Notes (Signed)
Pt resting after dilaudid iv given for leg pain, taking po's without difficulty, report given earlier to pt's care nurse. No bleeding nor hematoma at left groin site. sr per monitor, vss.

## 2015-12-26 NOTE — Progress Notes (Signed)
Procedure finished, pt vss , tolerated well, sats and etco2 remained stable entire procedure. starclosed 1735

## 2015-12-26 NOTE — Progress Notes (Signed)
[facility] [place] [facility]   PATIENT NAME: Bianca GardenerSusan Hill    MR#:  [id number]  DATE OF BIRTH:  [birth year]  SUBJECTIVE:  CHIEF COMPLAINT:   Chief Complaint  Patient presents with  . Foot Pain   Admitted for hyperglycemia and right foot pain with gangrene of right fourth toe. Patient ran out of her insulin and Accu strips for 2 weeks. Blood sugars used to run between 100-140 prior to that with episodes of hypoglycemia as low as 40. Has chronic lower extremity edema. Has not seen primary care physician since June 2016.  Continues to have right foot pain.  Blood sugars improved.  Scheduled for angiogram later today.  REVIEW OF SYSTEMS:    Review of Systems  Constitutional: Positive for malaise/fatigue. Negative for fever and chills.  HENT: Negative for sore throat.   Eyes: Negative for blurred vision, double vision and pain.  Respiratory: Negative for cough, hemoptysis, shortness of breath and wheezing.   Cardiovascular: Positive for leg swelling. Negative for chest pain, palpitations and orthopnea.  Gastrointestinal: Positive for nausea. Negative for heartburn, vomiting, abdominal pain, diarrhea and constipation.  Genitourinary: Negative for dysuria and hematuria.  Musculoskeletal: Negative for back pain and joint pain.  Skin: Negative for rash.  Neurological: Positive for dizziness and weakness. Negative for sensory change, speech change, focal weakness and headaches.  Endo/Heme/Allergies: Does not bruise/bleed easily.  Psychiatric/Behavioral: Negative for depression. The patient is not nervous/anxious.     DRUG ALLERGIES:  No Known Allergies  VITALS:  Blood pressure 164/80, pulse 79, temperature 98 F (36.7 C), temperature source Oral, resp. rate 19, height 6' (1.829 m), weight 170.099 kg (375 lb), last menstrual period 12/21/2015, SpO2 100 %.  PHYSICAL EXAMINATION:   Physical Exam  GENERAL:  40 y.o.-year-old patient lying in the bed  with no acute distress. Morbidly obese EYES: Pupils equal, round, reactive to light and accommodation. No scleral icterus. Extraocular muscles intact.  HEENT: Head atraumatic, normocephalic. Oropharynx and nasopharynx clear.  NECK:  Supple, no jugular venous distention. No thyroid enlargement, no tenderness.  LUNGS: Normal breath sounds bilaterally, no wheezing, rales, rhonchi. No use of accessory muscles of respiration.  CARDIOVASCULAR: S1, S2 normal. No murmurs, rubs, or gallops.  ABDOMEN: Soft, nontender, nondistended. Bowel sounds present. No organomegaly or mass.  EXTREMITIES: Bilateral lower extremity edema NEUROLOGIC: Cranial nerves II through XII are intact. No focal Motor or sensory deficits b/l.   PSYCHIATRIC: The patient is alert and oriented x 3.  SKIN: Right fourth toe gangrenous area  LABORATORY PANEL:   CBC  Recent Labs Lab 12/25/15 0417  WBC 9.5  HGB 14.0  HCT 40.8  PLT 184   ------------------------------------------------------------------------------------------------------------------ Chemistries   Recent Labs Lab 12/23/15 1722 12/24/15 0146  12/25/15 0417  NA 122* 128*  < > 129*  K 2.9* 2.7*  < > 3.8  CL 88* 98*  < > 102  CO2 25 20*  < > 22  GLUCOSE 806* 294*  < > 162*  BUN 19 20  < > 22*  CREATININE 1.84* 1.84*  < > 1.87*  CALCIUM 7.9* 8.1*  < > 7.6*  MG  --  1.9  --   --   AST 32  --   --   --   ALT 28  --   --   --   ALKPHOS 105  --   --   --   BILITOT 0.3  --   --   --   < > =  values in this interval not displayed. ------------------------------------------------------------------------------------------------------------------  Cardiac Enzymes  Recent Labs Lab 12/23/15 1722  TROPONINI <0.03   ------------------------------------------------------------------------------------------------------------------  RADIOLOGY:  Dg Chest Port 1 View  12/24/2015  CLINICAL DATA:  Status post PICC line placement EXAM: PORTABLE CHEST 1 VIEW  COMPARISON:  11/06/2015 FINDINGS: Cardiac shadow is stable. The lungs are well aerated bilaterally. A right-sided PICC line is noted with the catheter tip at the level of the cavoatrial junction. No other focal abnormality is seen. IMPRESSION: Status post PICC line in satisfactory position. Electronically Signed   By: Alcide Clever M.D.   On: 12/24/2015 18:48     ASSESSMENT AND PLAN:   * Hyper osmolar hyperglycemic state - resolved Due to not taking her insulin for 2 weeks. Stop IV fluids. She was much improved. Patient has been started on insulin 70/30 60 units 2 times a day. Takes 90 units 2 times a day at home. Sliding scale insulin.  * Pseudohyponatremia. Improving  * Right fourth toe necrosis. Stopped heparin drip as discussed with vascular surgery.  Scheduled for lower extremity angiogram today  * Hypokalemia.  Replace orally  * Hypertension Continue lisinopril and Lopressor with IV hydralazine when necessary.  * CKD stage III. Stable.  * Tobacco abuse.  Counseled on admission  * Morbid obesity.  All the records are reviewed and case discussed with Care Management/Social Workerr. Management plans discussed with the patient, family and they are in agreement.  I had discussed with [facility] transfer center regarding transferring the patient but transfer was refused as this is a routine transfer patient's request and [facility] does not have any beds. Discussed the same with patient and she has agreed to stay at [facility] and get her procedure and treatment done.  CODE STATUS: FULL  DVT Prophylaxis: SCDs  TOTAL TIME TAKING CARE OF THIS PATIENT: 35 minutes.  Milagros Loll R M.D on 12/26/2015 at 11:13 AM  Between 7am to 6pm - Pager - [phone]  After 6pm go to [email or web address] - password [email or web address] [facility]  [place] [facility]  Office  [phone]  CC: Primary care physician; No PCP Per Patient  Note: This dictation was prepared with Dragon dictation along with smaller phrase  technology. Any transcriptional errors that result from this process are unintentional.

## 2015-12-26 NOTE — Care Management Note (Signed)
Case Management Note  Patient Details  Name: Bianca BaltimoreSusan J Quevedo MRN: [id number] Date of Birth: 12-22-1975  Subjective/Objective:                 Patient was admitted for Hyper osmolar hyperglycemic state.  Patient states that she lives at home with her older sister who has had a stroke.  The only house hole income is the disability of the sister.  Patient states that she has recently enrolled at [employer].  Patient states that [employer] has sent her application over to [facility], but she was told it could take a couple of weeks to hear something back.  Patient states that her car was repossessed at the end of 2016.  Patient utilizes her cousin or taxis for transportation. Patient states that she has applied for disability but that she was denied.     Action/Plan: RNCM following for discharge needs  Expected Discharge Date:                  Expected Discharge Plan:     In-House Referral:     Discharge planning Services     Post Acute Care Choice:    Choice offered to:     DME Arranged:    DME Agency:     HH Arranged:    HH Agency:     Status of Service:     Medicare Important Message Given:    Date Medicare IM Given:    Medicare IM give by:    Date Additional Medicare IM Given:    Additional Medicare Important Message give by:     If discussed at Long Length of Stay Meetings, dates discussed:    Additional Comments:  Chapman FitchBOWEN, Kiala Faraj T, RN 12/26/2015, 3:14 PM

## 2015-12-27 LAB — GLUCOSE, CAPILLARY
GLUCOSE-CAPILLARY: 326 mg/dL — AB (ref 65–99)
Glucose-Capillary: 235 mg/dL — ABNORMAL HIGH (ref 65–99)

## 2015-12-27 MED ORDER — CEPHALEXIN 500 MG PO CAPS: 500.0000 mg | ORAL_CAPSULE | Freq: Three times a day (TID) | ORAL | Status: DC

## 2015-12-27 MED ORDER — APIXABAN 5 MG PO TABS: 5.0000 mg | ORAL_TABLET | Freq: Two times a day (BID) | ORAL | Status: AC

## 2015-12-27 MED ORDER — INSULIN NPH ISOPHANE & REGULAR (70-30) 100 UNIT/ML ~~LOC~~ SUSP: 80.0000 [IU] | Freq: Two times a day (BID) | SUBCUTANEOUS | Status: AC

## 2015-12-27 MED ORDER — CLINDAMYCIN HCL 300 MG PO CAPS: 300.0000 mg | ORAL_CAPSULE | Freq: Two times a day (BID) | ORAL | Status: AC

## 2015-12-27 MED ORDER — HYDROCODONE-ACETAMINOPHEN 5-325 MG PO TABS: 1.0000 | ORAL_TABLET | ORAL | Status: AC | PRN

## 2015-12-27 MED ORDER — FREESTYLE SYSTEM KIT: 1.0000 | PACK | Freq: Three times a day (TID) | Status: AC

## 2015-12-27 NOTE — Care Management (Signed)
Patient to be discharged home today.  I have spoken with [facility] and they confirmed that patient initiated services with them at the beginning of March.  Patient to discharge on Eliquis, Clinda, Insulin and Norco.  [facility] to provide Clinda and insulin.  I have provided patient with 30 day free trail coupon for Eliquis.  She is to pick up her Eliquis and Norco at a pharmacy of her choice.  This has verbally been explained to her as well as written instructions. Her paper work has been submitted to [facility].  Patient to follow up with setting up an initial appointment.  RNCM signing off.

## 2015-12-27 NOTE — Progress Notes (Signed)
Inpatient Diabetes Program Recommendations  AACE/ADA: New Consensus Statement on Inpatient Glycemic Control (2015)  Target Ranges:  Prepandial:   less than 140 mg/dL      Peak postprandial:   less than 180 mg/dL (1-2 hours)      Critically ill patients:  140 - 180 mg/dL   Review of Glycemic Control: Results for Bianca Hill, Bianca Hill (MRN [id number]) as of 12/27/2015 11:24  Ref. Range 12/26/2015 07:34 12/26/2015 11:38 12/26/2015 21:30 12/27/2015 07:48 12/27/2015 11:15  Glucose-Capillary Latest Ref Range: 65-99 mg/dL 409181 (H) 811180 (H) 914276 (H) 326 (H) 235 (H)    Inpatient Diabetes Program Recommendations:   Blood sugars greater than goal today.  Note that 70/30 not given yesterday due to NPO status.  70/30 resumed to day which should improved blood sugars.   Thanks, Beryl MeagerJenny Miki Blank, RN, BC-ADM Inpatient Diabetes Coordinator Pager [phone] (8a-5p)

## 2015-12-27 NOTE — Discharge Instructions (Signed)

## 2015-12-27 NOTE — Progress Notes (Signed)
[facility]  Daily Progress Note   Subjective  - 1 Day Post-Op  Some pain.  Overall doing well No events overnight  Objective Filed Vitals:   12/26/15 2015 12/27/15 0527 12/27/15 0907 12/27/15 0914  BP: 137/77 141/63 127/69 127/69  Pulse: 82 78 78 78  Temp: 97.7 F (36.5 C) 97.7 F (36.5 C) 98.1 F (36.7 C)   TempSrc: Oral Oral Oral   Resp: 20 20 17    Height:      Weight:      SpO2: 100% 100% 100%     Intake/Output Summary (Last 24 hours) at 12/27/15 1300 Last data filed at 12/27/15 0900  Gross per 24 hour  Intake    718 ml  Output   1400 ml  Net   -682 ml    PULM  CTAB CV  RRR VASC  Foot warm, gangrene on tip of right fourth toe  Laboratory CBC    Component Value Date/Time   WBC 9.5 12/25/2015 0417   WBC 10.9 07/29/2014 0447   HGB 14.0 12/25/2015 0417   HGB 14.4 07/29/2014 0447   HCT 40.8 12/25/2015 0417   HCT 43.1 07/29/2014 0447   PLT 184 12/25/2015 0417   PLT 166 07/29/2014 0447    BMET    Component Value Date/Time   NA 129* 12/25/2015 0417   NA 134* 07/30/2014 0732   K 3.8 12/25/2015 0417   K 3.9 07/30/2014 0732   CL 102 12/25/2015 0417   CL 102 07/30/2014 0732   CO2 22 12/25/2015 0417   CO2 24 07/30/2014 0732   GLUCOSE 162* 12/25/2015 0417   GLUCOSE 309* 07/30/2014 0732   BUN 22* 12/25/2015 0417   BUN 29* 07/30/2014 0732   CREATININE 1.87* 12/25/2015 0417   CREATININE 1.16 07/30/2014 0732   CALCIUM 7.6* 12/25/2015 0417   CALCIUM 7.9* 07/30/2014 0732   GFRNONAA 33* 12/25/2015 0417   GFRNONAA 56* 07/30/2014 0732   GFRNONAA >60 02/09/2014 1456   GFRAA 38* 12/25/2015 0417   GFRAA >60 07/30/2014 0732   GFRAA >60 02/09/2014 1456    Assessment/Planning: POD #1 s/p extensive RLE revascularization   Doing well  High risk for recurrence/thrombosis.  Would use Eliquis for 90 days if financially feasible  RTC 3 weeks with ABIs  Ok to discharge from my POV    DEW,JASON  12/27/2015, 1:00 PM

## 2015-12-29 NOTE — Discharge Summary (Signed)
[place] [facility] [place] NAME: Bianca Hill    MR#:  [id number]  DATE OF BIRTH:  1976/08/02  DATE OF ADMISSION:  12/23/2015 ADMITTING PHYSICIAN: Demetrios Loll, MD  DATE OF DISCHARGE: 12/27/2015  3:59 PM  PRIMARY CARE PHYSICIAN: No PCP Per Patient   ADMISSION DIAGNOSIS:  Swelling [R60.9] Hyponatremia [E87.1] Hyperglycemia [R73.9] Ischemic foot [I99.8]  DISCHARGE DIAGNOSIS:  Principal Problem:   Diabetic hyperosmolar non-ketotic state ([place])   SECONDARY DIAGNOSIS:   Past Medical History  Diagnosis Date  . Diabetes mellitus without complication ([place])   . Hypertension   . Hypercholesteremia      ADMITTING HISTORY  Bianca Hill is a 40 y.o. female with a known history of Hypertension, diabetes and hypercholesteremia. The patient has had right foot pain for the past one week. She noticed right fourth toe becoming blue. She has not had a normal feeling in the right foot for some time. She run out of Novolin 70/30 for the past 2 weeks due to financial reasons. She denies any other symptoms. Dr. Jimmye Norman, ED physician discussed with on-call vascular surgeon, who did not feel like she needs emergent intervention at this time.  HOSPITAL COURSE:   * Hyper osmolar hyperglycemic state - resolved Due to not taking her insulin for 2 weeks. Treated aggressively with IV fluids and IV insulin. Improved well and later transitioned to insulin 70/30. Patient had hypoglycemic episodes at home previously and her insulin is being reduced from 90 units 2 times a day to 8 units 2 times a day.  * Pseudohyponatremia. Resolved  * Right fourth toe necrosis. Initially was on heparin drip which was later stopped. Angiogram showed multiple areas of stenosis and thrombosis. Intervention by vascular surgery. Also placed on Eliquis 5 mg 2 times a day. Patient is to follow-up with Dr. dew of vascular surgery in 1-2 weeks. Podiatry follow-up if toe gangrene does not  improve. Clindamycin continued for right foot cellulitis.  * Hypokalemia.  Replaced orally  * Hypertension Continued on home medications.  * CKD stage III. Stable.  * Tobacco abuse.  Counseled on admission  * Morbid obesity.  Patient did request transfer to [facility] during the hospital stay. The transfer center was contacted who did not accept the patient in transfer to not having any bed availability. Later on discussing with the patient she agreed to have treatment and procedures done at [facility].  Discharged home in a stable condition to follow-up with primary care physician and vascular surgery.  CONSULTS OBTAINED:  Treatment Team:  Katha Cabal, MD  DRUG ALLERGIES:  No Known Allergies  DISCHARGE MEDICATIONS:   Discharge Medication List as of 12/27/2015 12:50 PM    START taking these medications   Details  apixaban (ELIQUIS) 5 MG TABS tablet Take 1 tablet (5 mg total) by mouth 2 (two) times daily., Starting 12/27/2015, Until Discontinued, Print    glucose monitoring kit (FREESTYLE) monitoring kit 1 each by Does not apply route 4 (four) times daily -  before meals and at bedtime., Starting 12/27/2015, Until Discontinued, Print    HYDROcodone-acetaminophen (NORCO/VICODIN) 5-325 MG tablet Take 1 tablet by mouth every 4 (four) hours as needed for moderate pain., Starting 12/27/2015, Until Discontinued, Print      CONTINUE these medications which have CHANGED   Details  clindamycin (CLEOCIN) 300 MG capsule Take 1 capsule (300 mg total) by mouth 2 (two) times daily., Starting 12/27/2015, Until Fri 01/06/16, Print  insulin NPH-regular Human (NOVOLIN 70/30) (70-30) 100 UNIT/ML injection Inject 80 Units into the skin 2 (two) times daily with a meal., Starting 12/27/2015, Until Discontinued, Print      CONTINUE these medications which have NOT CHANGED   Details  diphenhydrAMINE (BENADRYL) 50 MG capsule Take 100 mg by mouth at bedtime as  needed for sleep., Until Discontinued, Historical Med    furosemide (LASIX) 20 MG tablet Take 1 tablet (20 mg total) by mouth daily., Starting 11/06/2015, Until Mon 11/05/16, Print    hydrochlorothiazide (HYDRODIURIL) 25 MG tablet Take 1 tablet (25 mg total) by mouth daily., Starting 11/06/2015, Until Discontinued, Print    lisinopril (PRINIVIL,ZESTRIL) 20 MG tablet Take 1 tablet (20 mg total) by mouth daily., Starting 11/06/2015, Until Mon 11/05/16, Print    metFORMIN (GLUCOPHAGE) 500 MG tablet Take 1 tablet (500 mg total) by mouth 2 (two) times daily with a meal., Starting 11/06/2015, Until Discontinued, Print    metoprolol (LOPRESSOR) 50 MG tablet Take 1 tablet (50 mg total) by mouth 2 (two) times daily., Starting 09/20/2015, Until Wed 09/19/16, Print    simvastatin (ZOCOR) 20 MG tablet Take 20 mg by mouth at bedtime., Until Discontinued, Historical Med    albuterol (PROVENTIL HFA;VENTOLIN HFA) 108 (90 BASE) MCG/ACT inhaler Inhale 2 puffs into the lungs every 6 (six) hours as needed for wheezing or shortness of breath., Starting 09/22/2015, Until Discontinued, Print      STOP taking these medications     cephALEXin (KEFLEX) 500 MG capsule      glipiZIDE (GLUCOTROL) 5 MG tablet      hydrALAZINE (APRESOLINE) 100 MG tablet      levofloxacin (LEVAQUIN) 250 MG tablet      predniSONE (DELTASONE) 50 MG tablet         Today   VITAL SIGNS:  Blood pressure 127/69, pulse 78, temperature 98.1 F (36.7 C), temperature source Oral, resp. rate 17, height 6' (1.829 m), weight 170.099 kg (375 lb), last menstrual period 12/21/2015, SpO2 100 %.  I/O:  No intake or output data in the 24 hours ending 12/29/15 1521  PHYSICAL EXAMINATION:  Physical Exam  GENERAL:  40 y.o.-year-old patient lying in the bed with no acute distress. Morbidly obese LUNGS: Normal breath sounds bilaterally, no wheezing, rales,rhonchi or crepitation. No use of accessory muscles of respiration.  CARDIOVASCULAR: S1, S2 normal.  No murmurs, rubs, or gallops.  ABDOMEN: Soft, non-tender, non-distended. Bowel sounds present. No organomegaly or mass.  NEUROLOGIC: Moves all 4 extremities. PSYCHIATRIC: The patient is alert and oriented x 3.  SKIN: Right foot pulses improved and warm. Gangrenous toe tips stable. Erythema improved.  DATA REVIEW:   CBC  Recent Labs Lab 12/25/15 0417  WBC 9.5  HGB 14.0  HCT 40.8  PLT 184    Chemistries   Recent Labs Lab 12/23/15 1722 12/24/15 0146  12/25/15 0417  NA 122* 128*  < > 129*  K 2.9* 2.7*  < > 3.8  CL 88* 98*  < > 102  CO2 25 20*  < > 22  GLUCOSE 806* 294*  < > 162*  BUN 19 20  < > 22*  CREATININE 1.84* 1.84*  < > 1.87*  CALCIUM 7.9* 8.1*  < > 7.6*  MG  --  1.9  --   --   AST 32  --   --   --   ALT 28  --   --   --   ALKPHOS 105  --   --   --  BILITOT 0.3  --   --   --   < > = values in this interval not displayed.  Cardiac Enzymes  Recent Labs Lab 12/23/15 1722  TROPONINI <0.03    Microbiology Results  Results for orders placed or performed during the hospital encounter of 12/23/15  MRSA PCR Screening     Status: None   Collection Time: 12/23/15 10:23 PM  Result Value Ref Range Status   MRSA by PCR NEGATIVE NEGATIVE Final    Comment:        The GeneXpert MRSA Assay (FDA approved for NASAL specimens only), is one component of a comprehensive MRSA colonization surveillance program. It is not intended to diagnose MRSA infection nor to guide or monitor treatment for MRSA infections.     RADIOLOGY:  No results found.  Follow up with PCP in 1 week.  Management plans discussed with the patient, family and they are in agreement.  CODE STATUS:  Code Status History    Date Active Date Inactive Code Status Order ID Comments User Context   12/23/2015 10:20 PM 12/27/2015  7:12 PM Full Code [id number]  Demetrios Loll, MD Inpatient   09/24/2015 11:54 PM 09/27/2015  5:55 PM Full Code [id number]  Nicholes Mango, MD Inpatient      TOTAL TIME TAKING CARE  OF THIS PATIENT ON DAY OF DISCHARGE: more than 30 minutes.   Hillary Bow R M.D on 12/29/2015 at 3:21 PM  Between 7am to 6pm - Pager - [phone]  After 6pm go to [email or web address] - password [email or web address] [facility]  [place] [facility]  Office  [phone]  CC: Primary care physician; No PCP Per Patient  Note: This dictation was prepared with Dragon dictation along with smaller phrase technology. Any transcriptional errors that result from this process are unintentional.

## 2016-01-01 ENCOUNTER — Encounter: Payer: Self-pay | Admitting: Vascular Surgery

## 2016-10-01 DEATH — deceased
